# Patient Record
Sex: Male | Born: 1985
Health system: Southern US, Community
[De-identification: ages and names within clinical notes are randomized; demographics above are authoritative.]

## PROBLEM LIST (undated history)

## (undated) DIAGNOSIS — F909 Attention-deficit hyperactivity disorder, unspecified type: Secondary | ICD-10-CM

---
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
## 2006-12-18 HISTORY — PX: KNEE SURGERY: SHX244

## 2013-09-09 ENCOUNTER — Emergency Department (INDEPENDENT_AMBULATORY_CARE_PROVIDER_SITE_OTHER)
Admission: EM | Admit: 2013-09-09 | Discharge: 2013-09-09 | Disposition: A | Discharge status: Home / Self Care | Payer: Self-pay | Source: Home / Self Care

## 2013-09-09 ENCOUNTER — Encounter (HOSPITAL_COMMUNITY): Payer: Self-pay | Admitting: Emergency Medicine

## 2013-09-09 DIAGNOSIS — J410 Simple chronic bronchitis: Secondary | ICD-10-CM

## 2013-09-09 DIAGNOSIS — Z720 Tobacco use: Secondary | ICD-10-CM

## 2013-09-09 DIAGNOSIS — J019 Acute sinusitis, unspecified: Secondary | ICD-10-CM

## 2013-09-09 DIAGNOSIS — J40 Bronchitis, not specified as acute or chronic: Secondary | ICD-10-CM

## 2013-09-09 MED ORDER — FLUTICASONE PROPIONATE 50 MCG/ACT NA SUSP: 2.0000 | Freq: Two times a day (BID) | NASAL | Status: DC

## 2013-09-09 MED ORDER — HYDROCOD POLST-CHLORPHEN POLST 10-8 MG/5ML PO LQCR: 5.0000 mL | Freq: Two times a day (BID) | ORAL | Status: DC | PRN

## 2013-09-09 MED ORDER — ONDANSETRON 4 MG PO TBDP
ORAL_TABLET | ORAL | Status: AC
  Filled 2013-09-09: qty 1

## 2013-09-09 MED ORDER — ONDANSETRON 4 MG PO TBDP: 4.0000 mg | ORAL_TABLET | Freq: Once | ORAL | Status: DC

## 2013-09-09 MED ORDER — LEVOFLOXACIN 500 MG PO TABS: 500.0000 mg | ORAL_TABLET | Freq: Every day | ORAL | Status: DC

## 2013-09-09 NOTE — ED Provider Notes (Addendum)
CSN: [id number]     Arrival date & time 09/09/13  0272 History   None    Chief Complaint  Patient presents with  . URI    onset last friday. fever. cong. cough. nausea. sinus pressure and pain   (Consider location/radiation/quality/duration/timing/severity/associated sxs/prior Treatment) Patient is a 27 y.o. male presenting with URI. The history is provided by the patient.  URI Presenting symptoms: congestion, cough, fever and rhinorrhea   Severity:  Moderate Onset quality:  Gradual Duration:  5 days Progression:  Worsening Chronicity:  New Ineffective treatments:  OTC medications Associated symptoms: sinus pain and wheezing   Risk factors comment:  Smoker   History reviewed. No pertinent past medical history. Past Surgical History  Procedure Laterality Date  . Knee surgery     History reviewed. No pertinent family history. History  Substance Use Topics  . Smoking status: Current Every Day Smoker -- 0.50 packs/day    Types: Cigarettes  . Smokeless tobacco: Not on file  . Alcohol Use: Yes    Review of Systems  Constitutional: Positive for fever.  HENT: Positive for congestion and rhinorrhea.   Respiratory: Positive for cough and wheezing.     Allergies  Review of patient's allergies indicates no known allergies.  Home Medications   Current Outpatient Rx  Name  Route  Sig  Dispense  Refill  . chlorpheniramine-HYDROcodone (TUSSIONEX PENNKINETIC ER) 10-8 MG/5ML LQCR   Oral   Take 5 mLs by mouth every 12 (twelve) hours as needed.   115 mL   1   . fluticasone (FLONASE) 50 MCG/ACT nasal spray   Nasal   Place 2 sprays into the nose 2 (two) times daily.   1 g   2   . levofloxacin (LEVAQUIN) 500 MG tablet   Oral   Take 1 tablet (500 mg total) by mouth daily.   10 tablet   0    BP 119/77  Pulse 64  Temp(Src) 97.5 F (36.4 C) (Oral)  Resp 14  SpO2 95% Physical Exam  Nursing note and vitals reviewed. Constitutional: He is oriented to person, place, and  time. He appears well-developed and well-nourished.  HENT:  Head: Normocephalic.  Right Ear: External ear normal.  Left Ear: External ear normal.  Nose: Mucosal edema and rhinorrhea present.  Mouth/Throat: Oropharynx is clear and moist.  Eyes: Conjunctivae are normal. Pupils are equal, round, and reactive to light.  Neck: Normal range of motion. Neck supple.  Cardiovascular: Normal rate, regular rhythm and normal heart sounds.   Pulmonary/Chest: He has wheezes. He exhibits no tenderness.  Abdominal: Soft. Bowel sounds are normal.  Lymphadenopathy:    He has no cervical adenopathy.  Neurological: He is alert and oriented to person, place, and time.  Skin: Skin is warm and dry.    ED Course  Procedures (including critical care time) Labs Review Labs Reviewed - No data to display Imaging Review No results found.  MDM      Linna Hoff, MD 09/09/13 1031  Linna Hoff, MD 09/09/13 1034

## 2013-09-09 NOTE — ED Notes (Signed)
C/o productive cough with dark colored sputum. Fever. Congestion with wheezing. No hx of asthma. Nausea onset today. Bilateral eye redness and irritation. States eyes are crusted in the a.m Pt has been taking mucinex with no relief and tylenol for fever.  Onset Friday.  Denies vomiting and diarrhea.

## 2013-10-07 ENCOUNTER — Encounter (HOSPITAL_COMMUNITY): Payer: Self-pay | Admitting: Emergency Medicine

## 2013-10-07 ENCOUNTER — Emergency Department (INDEPENDENT_AMBULATORY_CARE_PROVIDER_SITE_OTHER)
Admission: EM | Admit: 2013-10-07 | Discharge: 2013-10-07 | Disposition: A | Discharge status: Home / Self Care | Payer: Self-pay | Source: Home / Self Care

## 2013-10-07 DIAGNOSIS — A0811 Acute gastroenteropathy due to Norwalk agent: Secondary | ICD-10-CM

## 2013-10-07 MED ORDER — ONDANSETRON HCL 4 MG PO TABS: 4.0000 mg | ORAL_TABLET | Freq: Four times a day (QID) | ORAL | Status: DC

## 2013-10-07 MED ORDER — ONDANSETRON 4 MG PO TBDP
ORAL_TABLET | ORAL | Status: AC
  Filled 2013-10-07: qty 2

## 2013-10-07 MED ORDER — ONDANSETRON 4 MG PO TBDP
8.0000 mg | ORAL_TABLET | Freq: Once | ORAL | Status: AC
  Administered 2013-10-07: 8 mg via ORAL

## 2013-10-07 NOTE — ED Provider Notes (Signed)
CSN: [id number]     Arrival date & time 10/07/13  1843 History   None    Chief Complaint  Patient presents with  . Emesis   (Consider location/radiation/quality/duration/timing/severity/associated sxs/prior Treatment) Patient is a 27 y.o. male presenting with vomiting. The history is provided by the patient.  Emesis Severity:  Moderate Duration:  12 hours Timing:  Intermittent Quality:  Stomach contents Progression:  Unchanged Chronicity:  New Worsened by:  Nothing tried Ineffective treatments:  None tried Associated symptoms: diarrhea   Associated symptoms: no abdominal pain, no chills, no cough and no fever   Risk factors: no sick contacts, no suspect food intake and no travel to endemic areas     History reviewed. No pertinent past medical history. Past Surgical History  Procedure Laterality Date  . Knee surgery     History reviewed. No pertinent family history. History  Substance Use Topics  . Smoking status: Current Every Day Smoker -- 0.50 packs/day    Types: Cigarettes  . Smokeless tobacco: Not on file  . Alcohol Use: Yes    Review of Systems  Constitutional: Negative.  Negative for chills.  Gastrointestinal: Positive for nausea, vomiting and diarrhea. Negative for abdominal pain, blood in stool, anal bleeding and rectal pain.    Allergies  Review of patient's allergies indicates no known allergies.  Home Medications   Current Outpatient Rx  Name  Route  Sig  Dispense  Refill  . chlorpheniramine-HYDROcodone (TUSSIONEX PENNKINETIC ER) 10-8 MG/5ML LQCR   Oral   Take 5 mLs by mouth every 12 (twelve) hours as needed.   115 mL   1   . fluticasone (FLONASE) 50 MCG/ACT nasal spray   Nasal   Place 2 sprays into the nose 2 (two) times daily.   1 g   2   . levofloxacin (LEVAQUIN) 500 MG tablet   Oral   Take 1 tablet (500 mg total) by mouth daily.   10 tablet   0   . ondansetron (ZOFRAN) 4 MG tablet   Oral   Take 1 tablet (4 mg total) by mouth every 6  (six) hours.   6 tablet   0    There were no vitals taken for this visit. Physical Exam  Nursing note and vitals reviewed. Constitutional: He is oriented to person, place, and time. He appears well-developed and well-nourished. No distress.  HENT:  Mouth/Throat: Oropharynx is clear and moist.  Neck: Normal range of motion. Neck supple.  Abdominal: Soft. Bowel sounds are normal. He exhibits no distension and no mass. There is no tenderness. There is no rebound and no guarding.  Lymphadenopathy:    He has no cervical adenopathy.  Neurological: He is alert and oriented to person, place, and time.  Skin: Skin is warm and dry.    ED Course  Procedures (including critical care time) Labs Review Labs Reviewed - No data to display Imaging Review No results found.    MDM      Linna Hoff, MD 10/07/13 2022

## 2013-10-07 NOTE — ED Notes (Signed)
C/o vomiting which started this morning.  Patient states he does have diarrhea.  Admits to eating at sub station yesterday.

## 2016-01-25 ENCOUNTER — Encounter: Payer: Self-pay | Admitting: Family Medicine

## 2016-01-25 ENCOUNTER — Ambulatory Visit (INDEPENDENT_AMBULATORY_CARE_PROVIDER_SITE_OTHER): Payer: 59 | Admitting: Family Medicine

## 2016-01-25 VITALS — BP 106/73 | HR 97 | Temp 98.4°F | Wt 210.9 lb

## 2016-01-25 DIAGNOSIS — F1911 Other psychoactive substance abuse, in remission: Secondary | ICD-10-CM | POA: Insufficient documentation

## 2016-01-25 DIAGNOSIS — Z720 Tobacco use: Secondary | ICD-10-CM

## 2016-01-25 DIAGNOSIS — F17200 Nicotine dependence, unspecified, uncomplicated: Secondary | ICD-10-CM | POA: Insufficient documentation

## 2016-01-25 DIAGNOSIS — F909 Attention-deficit hyperactivity disorder, unspecified type: Secondary | ICD-10-CM

## 2016-01-25 DIAGNOSIS — F900 Attention-deficit hyperactivity disorder, predominantly inattentive type: Secondary | ICD-10-CM | POA: Insufficient documentation

## 2016-01-25 DIAGNOSIS — Z87898 Personal history of other specified conditions: Secondary | ICD-10-CM | POA: Diagnosis not present

## 2016-01-25 MED ORDER — AMPHETAMINE-DEXTROAMPHET ER 30 MG PO CP24: 30.0000 mg | ORAL_CAPSULE | Freq: Every day | ORAL | Status: DC

## 2016-01-25 MED ORDER — AMPHETAMINE-DEXTROAMPHETAMINE 30 MG PO TABS: 30.0000 mg | ORAL_TABLET | Freq: Every day | ORAL | Status: DC

## 2016-01-25 MED FILL — AMPHETAMINE SALTS 30 MG TAB: 30 | 30 days supply | Qty: 30 | Fill #0

## 2016-01-25 MED FILL — DEXTROAMP-AMPHET ER 30 MG C: 30 | 30 days supply | Qty: 30 | Fill #0

## 2016-01-25 NOTE — Progress Notes (Signed)
HPI  CC: establish care Patient is here to establish care in our clinic. He states that he recently obtained insurance and is in need of establishing care. He has some previous medical history which is in need of medications and appropriate monitoring. He also states that he is very much interested in "getting healthier". He states that he is currently engaged and would like to develop healthier habits before he begins having children. He endorses a 1 pack a day smoking history over the past 10 years. He states that he is not very physically active but would like to improve this. He also states his fiance is a current smoker and the couple is trying to make these changes at the same time.  ROS: He denies any recent fatigue, dizziness, vision changes, headache, shortness of breath, chest pain, abdominal pain, nausea, vomiting, diarrhea, constipation, weakness, or numbness.   Past medical history and social history reviewed and updated in the EMR as appropriate.  Objective: BP 106/73 mmHg  Pulse 97  Temp(Src) 98.4 F (36.9 C) (Oral)  Wt 210 lb 14.4 oz (95.664 kg) Gen: NAD, alert, cooperative, and pleasant. HEENT: NCAT, EOMI, PERRL CV: RRR, no murmur Resp: CTAB, no wheezes, non-labored Neuro: Alert and oriented, Speech clear, No gross deficits, appears slightly anxious versus hyperactive.  Assessment and plan:  Attention deficit hyperactivity disorder (ADHD) Patient endorses a previous diagnosis with ADHD. He states that in middle school he was diagnosed by a psychiatrist and was able to describe the appropriate workup and tests that were performed. He states that he has not been able to be on this medication for quite some time and it is starting to inhibit some of his abilities at work. He would like to get back on this medication and we are able to discuss appropriate methods to which to do so. - I prescribed patient Adderall XR for daily use as well as Adderall IR to use when  necessary for days requiring longer focus (for "breakthrough"). - Patient is to follow-up in 3 months  H/O: substance abuse Patient states that he is currently being treated with methadone. He is seen at an outside provider for this medication.  Tobacco abuse Patient is currently smoking 1 pack a day and has been for the past 10 years. He is interested in quitting today. We discussed options for this issue. I have asked that he think about the Route which he wants to begin as well as picking a date in the future to act as the quit date.      Meds ordered this encounter  Medications  . DISCONTD: amphetamine-dextroamphetamine (ADDERALL XR) 30 MG 24 hr capsule    Sig: Take 1 capsule (30 mg total) by mouth daily.    Dispense:  31 capsule    Refill:  0  . DISCONTD: amphetamine-dextroamphetamine (ADDERALL) 30 MG tablet    Sig: Take 1 tablet by mouth daily.    Dispense:  31 tablet    Refill:  0  . DISCONTD: amphetamine-dextroamphetamine (ADDERALL XR) 30 MG 24 hr capsule    Sig: Take 1 capsule (30 mg total) by mouth daily.    Dispense:  31 capsule    Refill:  0    Do Not Fill Until 02/22/2016  . amphetamine-dextroamphetamine (ADDERALL XR) 30 MG 24 hr capsule    Sig: Take 1 capsule (30 mg total) by mouth daily.    Dispense:  31 capsule    Refill:  0    Do Not Fill  Until 03/24/2016  . DISCONTD: amphetamine-dextroamphetamine (ADDERALL) 30 MG tablet    Sig: Take 1 tablet by mouth daily.    Dispense:  31 tablet    Refill:  0    Do Not Fill Until 02/22/2016  . amphetamine-dextroamphetamine (ADDERALL) 30 MG tablet    Sig: Take 1 tablet by mouth daily.    Dispense:  31 tablet    Refill:  0    Do Not Fill Until 03/24/2016     Kathee Delton, MD,MS,  PGY2 01/25/2016 9:13 PM

## 2016-01-25 NOTE — Assessment & Plan Note (Signed)
Patient is currently smoking 1 pack a day and has been for the past 10 years. He is interested in quitting today. We discussed options for this issue. I have asked that he think about the Route which he wants to begin as well as picking a date in the future to act as the quit date.

## 2016-01-25 NOTE — Assessment & Plan Note (Signed)
Patient states that he is currently being treated with methadone. He is seen at an outside provider for this medication.

## 2016-01-25 NOTE — Assessment & Plan Note (Signed)
Patient endorses a previous diagnosis with ADHD. He states that in middle school he was diagnosed by a psychiatrist and was able to describe the appropriate workup and tests that were performed. He states that he has not been able to be on this medication for quite some time and it is starting to inhibit some of his abilities at work. He would like to get back on this medication and we are able to discuss appropriate methods to which to do so. - I prescribed patient Adderall XR for daily use as well as Adderall IR to use when necessary for days requiring longer focus (for "breakthrough"). - Patient is to follow-up in 3 months

## 2016-01-25 NOTE — Patient Instructions (Signed)
It was a pleasure seeing you today in our clinic. Today we established you in our clinic. Here is the treatment plan we have discussed and agreed upon together:   - I provided you orders for medications you've been on. - I would like to have you follow-up with me in 3 months for medication refills as well as a physical exam. - If you have any questions or issues to discuss do not hesitate to contact our office and have our staff leave me a message.

## 2016-02-22 MED FILL — DEXTROAMP-AMP 30 MG TABLET: 30 | 30 days supply | Qty: 30 | Fill #0

## 2016-02-22 MED FILL — DEXTROAMP-AMPHET ER 30 MG C: 30 | 30 days supply | Qty: 30 | Fill #0

## 2016-03-24 MED FILL — DEXTROAMP-AMPHET ER 30 MG C: 30 | 30 days supply | Qty: 30 | Fill #0

## 2016-03-24 MED FILL — DEXTROAMP-AMP 30 MG TABLET: 30 | 30 days supply | Qty: 30 | Fill #0

## 2016-04-21 ENCOUNTER — Ambulatory Visit (INDEPENDENT_AMBULATORY_CARE_PROVIDER_SITE_OTHER): Payer: 59 | Admitting: Family Medicine

## 2016-04-21 ENCOUNTER — Encounter: Payer: Self-pay | Admitting: Family Medicine

## 2016-04-21 VITALS — BP 115/71 | HR 77 | Temp 98.2°F | Ht 69.0 in | Wt 200.0 lb

## 2016-04-21 DIAGNOSIS — F419 Anxiety disorder, unspecified: Secondary | ICD-10-CM | POA: Diagnosis not present

## 2016-04-21 DIAGNOSIS — F909 Attention-deficit hyperactivity disorder, unspecified type: Secondary | ICD-10-CM | POA: Diagnosis not present

## 2016-04-21 DIAGNOSIS — F411 Generalized anxiety disorder: Secondary | ICD-10-CM | POA: Insufficient documentation

## 2016-04-21 MED ORDER — AMPHETAMINE-DEXTROAMPHET ER 30 MG PO CP24: 30.0000 mg | ORAL_CAPSULE | Freq: Every day | ORAL | Status: DC

## 2016-04-21 MED ORDER — AMPHETAMINE-DEXTROAMPHETAMINE 30 MG PO TABS: 30.0000 mg | ORAL_TABLET | Freq: Every day | ORAL | Status: DC

## 2016-04-21 MED FILL — DEXTROAMP-AMPHET ER 30 MG C: 30 | 30 days supply | Qty: 30 | Fill #0

## 2016-04-21 MED FILL — DEXTROAMP-AMP 30 MG TABLET: 30 | 30 days supply | Qty: 30 | Fill #0

## 2016-04-21 NOTE — Assessment & Plan Note (Signed)
Patient had one episode of an "anxiety attack". We discussed this in detail during the visit and I expressed that his current Adderall prescription could make this worse. I informed him that I would recommend that he reduce the use of the instant release Adderall prescribed for his breakthrough symptoms, as this may cause some of his anxiety. - Because this is the first occurrence and he has not had any subsequent episodes I will not be prescribing any medications at this time. - If patient does begin to exhibit more frequent symptoms I will most certainly reduce the dosing of his Adderall. I would also plan on providing a short prescription for Atarax, however none of these actions seem necessary at this time.

## 2016-04-21 NOTE — Progress Notes (Signed)
HPI  CC: Medication refill and anxiety. Patient is here for medication refill. He states that he has been doing significantly better since the initiation of his recent medication. He feels as though his thoughts are much more concise and he does not have any issues with focus. He is very pleased with the current dosing of the medication regimen at this time and does not desire to increase or decrease at this time. He denies any issues with palpitations shortness of breath dry mouth headache nausea vomiting or diarrhea.  Patient does endorse a recent "anxiety attack". He states that while he was on car in a baseball game he became very anxious, and flustered. He states that he "knew it was coming". He endorses periodic shortness of breath and diaphoresis. Denies any chest pain. He states that he had to call a timeout during the game in order for him to calm down. This lasted for about 15-20 minutes. He has not had experiences like this since.  He was recently brought to my attention the patient is currently prescribed Suboxone. This makes me significantly uncomfortable prescribing Adderall. He states that he is seen on a monthly basis by Dr. Wynonia Lawman.   Review of Systems   See HPI for ROS. All other systems reviewed and are negative.  CC, SH/smoking status, and VS noted  Objective: BP 115/71 mmHg  Pulse 77  Temp(Src) 98.2 F (36.8 C) (Oral)  Ht  (1.753 m)  Wt 200 lb (90.719 kg)  BMI 29.52 kg/m2 Gen: NAD, alert, cooperative, and pleasant. CV: RRR, no murmur Resp: CTAB, no wheezes, non-labored Abd: SNTND, BS present, no guarding or organomegaly Ext: No edema, warm Neuro: Alert and oriented, Speech clear, No gross deficits  Assessment and plan:  Attention deficit hyperactivity disorder (ADHD) Improved: Patient states that his lack of focus and inattention has improved since initiation of his Adderall medications. His recently brought to my attention that patient is on Suboxone for  his history of opiate addiction. Because of this I am very uncomfortable with him being on Adderall without discussing with the provider prescribing his Suboxone. I have attempted to contact Dr. Sharyon Medicus. He is currently out of the office but I left a message to have him contact our office with an ideal time to discuss this patient. - Patient's Adderall prescriptions were (reluctantly) refilled. - I will look further and this issue as I am currently undecided on whether or not I will continue prescribing Adderall for this patient.  Anxiety Patient had one episode of an "anxiety attack". We discussed this in detail during the visit and I expressed that his current Adderall prescription could make this worse. I informed him that I would recommend that he reduce the use of the instant release Adderall prescribed for his breakthrough symptoms, as this may cause some of his anxiety. - Because this is the first occurrence and he has not had any subsequent episodes I will not be prescribing any medications at this time. - If patient does begin to exhibit more frequent symptoms I will most certainly reduce the dosing of his Adderall. I would also plan on providing a short prescription for Atarax, however none of these actions seem necessary at this time.    Meds ordered this encounter  Medications  . DISCONTD: amphetamine-dextroamphetamine (ADDERALL XR) 30 MG 24 hr capsule    Sig: Take 1 capsule (30 mg total) by mouth daily.    Dispense:  31 capsule    Refill:  0  Do Not Fill Until 04/23/2016  . DISCONTD: amphetamine-dextroamphetamine (ADDERALL) 30 MG tablet    Sig: Take 1 tablet by mouth daily.    Dispense:  31 tablet    Refill:  0    Do Not Fill Until 04/23/2016  . DISCONTD: amphetamine-dextroamphetamine (ADDERALL XR) 30 MG 24 hr capsule    Sig: Take 1 capsule (30 mg total) by mouth daily.    Dispense:  31 capsule    Refill:  0    Do Not Fill Until 05/24/2016  . DISCONTD:  amphetamine-dextroamphetamine (ADDERALL) 30 MG tablet    Sig: Take 1 tablet by mouth daily.    Dispense:  31 tablet    Refill:  0    Do Not Fill Until 05/24/2016  . amphetamine-dextroamphetamine (ADDERALL XR) 30 MG 24 hr capsule    Sig: Take 1 capsule (30 mg total) by mouth daily.    Dispense:  31 capsule    Refill:  0    Do Not Fill Until 06/23/2016  . amphetamine-dextroamphetamine (ADDERALL) 30 MG tablet    Sig: Take 1 tablet by mouth daily.    Dispense:  31 tablet    Refill:  0    Do Not Fill Until 06/23/2016     Kathee DeltonIan D Niquita Digioia, MD,MS,  PGY2 04/21/2016 5:22 PM

## 2016-04-21 NOTE — Patient Instructions (Signed)
It was a pleasure seeing you today in our clinic. Today we discussed your medications. Here is the treatment plan we have discussed and agreed upon together:   - Continue taking the medications as prescribed. He does not always have to use the instant release Adderall, as he should only use this for breakthrough symptoms. My hope is that you will require less refills for this medication than with the extended release. - I would like to see you sooner if she continued to have any issues with anxiety. - I will be contacting your other provider to discuss his feelings on continued treatment for ADHD with your history of substance abuse, as I feel he likely has a better grasp on your past medical history.

## 2016-04-21 NOTE — Assessment & Plan Note (Signed)
Improved: Patient states that his lack of focus and inattention has improved since initiation of his Adderall medications. His recently brought to my attention that patient is on Suboxone for his history of opiate addiction. Because of this I am very uncomfortable with him being on Adderall without discussing with the provider prescribing his Suboxone. I have attempted to contact Dr. Sharyon MedicusHijazi. He is currently out of the office but I left a message to have him contact our office with an ideal time to discuss this patient. - Patient's Adderall prescriptions were (reluctantly) refilled. - I will look further and this issue as I am currently undecided on whether or not I will continue prescribing Adderall for this patient.

## 2016-05-24 MED FILL — DEXTROAMP-AMPHET ER 30 MG C: 30 | 30 days supply | Qty: 30 | Fill #0

## 2016-05-24 MED FILL — AMPHETAMINE SALTS 30 MG TAB: 30 | 30 days supply | Qty: 30 | Fill #0

## 2016-06-26 MED FILL — DEXTROAMP-AMP 30 MG TABLET: 30 | 30 days supply | Qty: 30 | Fill #0

## 2016-06-26 MED FILL — DEXTROAMP-AMPHET ER 30 MG C: 30 | 30 days supply | Qty: 30 | Fill #0

## 2016-07-17 ENCOUNTER — Ambulatory Visit (INDEPENDENT_AMBULATORY_CARE_PROVIDER_SITE_OTHER): Payer: 59 | Admitting: Family Medicine

## 2016-07-17 ENCOUNTER — Encounter: Payer: Self-pay | Admitting: Family Medicine

## 2016-07-17 DIAGNOSIS — F900 Attention-deficit hyperactivity disorder, predominantly inattentive type: Secondary | ICD-10-CM | POA: Diagnosis not present

## 2016-07-17 MED ORDER — AMPHETAMINE-DEXTROAMPHET ER 30 MG PO CP24: 30.0000 mg | ORAL_CAPSULE | Freq: Every day | ORAL | 0 refills | Status: DC

## 2016-07-17 MED ORDER — AMPHETAMINE-DEXTROAMPHETAMINE 30 MG PO TABS: 30.0000 mg | ORAL_TABLET | Freq: Every day | ORAL | 0 refills | Status: DC

## 2016-07-17 NOTE — Progress Notes (Signed)
   HPI  CC: Medication refill Patient is here for medication refill. He states he is doing very well on the current medication regimen. He states that he has no interest in changing the current regimen. He denies any symptoms or side effects at this time. He states that work is going well and he has had no issues with dehydration at this time.  ROS: Denies fever, chills, shortness breath, palpitations, chest pain, weight loss, dizziness, lightheadedness, syncope, or anorexia.  CC, SH/smoking status, and VS noted  Objective: BP 117/82   Pulse 81   Temp 98.5 F (36.9 C) (Oral)   Ht 5\' 9"  (1.753 m)   Wt 197 lb 3.2 oz (89.4 kg)   BMI 29.12 kg/m  Gen: NAD, alert, cooperative, and pleasant. CV: RRR, no murmur Resp: CTAB, no wheezes, non-labored Neuro: Alert, Speech clear, No gross deficits  Assessment and plan:  Attention deficit hyperactivity disorder (ADHD) Patient is here for medication refill. He states he is doing well and has no interest in changing medication regimen at this time. - Refills provided   Meds ordered this encounter  Medications  . DISCONTD: amphetamine-dextroamphetamine (ADDERALL XR) 30 MG 24 hr capsule    Sig: Take 1 capsule (30 mg total) by mouth daily.    Dispense:  31 capsule    Refill:  0    Do Not Fill Until 07/17/2016  . DISCONTD: amphetamine-dextroamphetamine (ADDERALL XR) 30 MG 24 hr capsule    Sig: Take 1 capsule (30 mg total) by mouth daily.    Dispense:  31 capsule    Refill:  0    Do Not Fill Until 08/16/2016  . amphetamine-dextroamphetamine (ADDERALL XR) 30 MG 24 hr capsule    Sig: Take 1 capsule (30 mg total) by mouth daily.    Dispense:  31 capsule    Refill:  0    Do Not Fill Until 09/16/2016  . DISCONTD: amphetamine-dextroamphetamine (ADDERALL) 30 MG tablet    Sig: Take 1 tablet by mouth daily.    Dispense:  31 tablet    Refill:  0    Do Not Fill Until 07/16/2016  . DISCONTD: amphetamine-dextroamphetamine (ADDERALL) 30 MG tablet   Sig: Take 1 tablet by mouth daily.    Dispense:  31 tablet    Refill:  0    Do Not Fill Until 08/16/2016  . amphetamine-dextroamphetamine (ADDERALL) 30 MG tablet    Sig: Take 1 tablet by mouth daily.    Dispense:  31 tablet    Refill:  0    Do Not Fill Until 09/16/2016     Kathee Delton, MD,MS,  PGY3 07/17/2016 6:35 PM

## 2016-07-17 NOTE — Assessment & Plan Note (Signed)
Patient is here for medication refill. He states he is doing well and has no interest in changing medication regimen at this time. - Refills provided

## 2016-07-17 NOTE — Patient Instructions (Signed)
Attention Deficit Hyperactivity Disorder  Attention deficit hyperactivity disorder (ADHD) is a problem with behavior issues based on the way the brain functions (neurobehavioral disorder). It is a common reason for behavior and academic problems in school.  SYMPTOMS   There are 3 types of ADHD. The 3 types and some of the symptoms include:  · Inattentive.    Gets bored or distracted easily.    Loses or forgets things. Forgets to hand in homework.    Has trouble organizing or completing tasks.    Difficulty staying on task.    An inability to organize daily tasks and school work.    Leaving projects, chores, or homework unfinished.    Trouble paying attention or responding to details. Careless mistakes.    Difficulty following directions. Often seems like is not listening.    Dislikes activities that require sustained attention (like chores or homework).  · Hyperactive-impulsive.    Feels like it is impossible to sit still or stay in a seat. Fidgeting with hands and feet.    Trouble waiting turn.    Talking too much or out of turn. Interruptive.    Speaks or acts impulsively.    Aggressive, disruptive behavior.    Constantly busy or on the go; noisy.    Often leaves seat when they are expected to remain seated.    Often runs or climbs where it is not appropriate, or feels very restless.  · Combined.    Has symptoms of both of the above.  Often children with ADHD feel discouraged about themselves and with school. They often perform well below their abilities in school.  As children get older, the excess motor activities can calm down, but the problems with paying attention and staying organized persist. Most children do not outgrow ADHD but with good treatment can learn to cope with the symptoms.  DIAGNOSIS   When ADHD is suspected, the diagnosis should be made by professionals trained in ADHD. This professional will collect information about the individual suspected of having ADHD. Information must be collected from  various settings where the person lives, works, or attends school.    Diagnosis will include:  · Confirming symptoms began in childhood.  · Ruling out other reasons for the child's behavior.  · The health care providers will check with the child's school and check their medical records.  · They will talk to teachers and parents.  · Behavior rating scales for the child will be filled out by those dealing with the child on a daily basis.  A diagnosis is made only after all information has been considered.  TREATMENT   Treatment usually includes behavioral treatment, tutoring or extra support in school, and stimulant medicines. Because of the way a person's brain works with ADHD, these medicines decrease impulsivity and hyperactivity and increase attention. This is different than how they would work in a person who does not have ADHD. Other medicines used include antidepressants and certain blood pressure medicines.  Most experts agree that treatment for ADHD should address all aspects of the person's functioning. Along with medicines, treatment should include structured classroom management at school. Parents should reward good behavior, provide constant discipline, and set limits. Tutoring should be available for the child as needed.  ADHD is a lifelong condition. If untreated, the disorder can have long-term serious effects into adolescence and adulthood.  HOME CARE INSTRUCTIONS   · Often with ADHD there is a lot of frustration among family members dealing with the condition. Blame   and anger are also feelings that are common. In many cases, because the problem affects the family as a whole, the entire family may need help. A therapist can help the family find better ways to handle the disruptive behaviors of the person with ADHD and promote change. If the person with ADHD is young, most of the therapist's work is with the parents. Parents will learn techniques for coping with and improving their child's behavior.  Sometimes only the child with the ADHD needs counseling. Your health care providers can help you make these decisions.  · Children with ADHD may need help learning how to organize. Some helpful tips include:  ¨ Keep routines the same every day from wake-up time to bedtime. Schedule all activities, including homework and playtime. Keep the schedule in a place where the person with ADHD will often see it. Mark schedule changes as far in advance as possible.  ¨ Schedule outdoor and indoor recreation.  ¨ Have a place for everything and keep everything in its place. This includes clothing, backpacks, and school supplies.  ¨ Encourage writing down assignments and bringing home needed books. Work with your child's teachers for assistance in organizing school work.  · Offer your child a well-balanced diet. Breakfast that includes a balance of whole grains, protein, and fruits or vegetables is especially important for school performance. Children should avoid drinks with caffeine including:  ¨ Soft drinks.  ¨ Coffee.  ¨ Tea.  ¨ However, some older children (adolescents) may find these drinks helpful in improving their attention. Because it can also be common for adolescents with ADHD to become addicted to caffeine, talk with your health care provider about what is a safe amount of caffeine intake for your child.  · Children with ADHD need consistent rules that they can understand and follow. If rules are followed, give small rewards. Children with ADHD often receive, and expect, criticism. Look for good behavior and praise it. Set realistic goals. Give clear instructions. Look for activities that can foster success and self-esteem. Make time for pleasant activities with your child. Give lots of affection.  · Parents are their children's greatest advocates. Learn as much as possible about ADHD. This helps you become a stronger and better advocate for your child. It also helps you educate your child's teachers and instructors  if they feel inadequate in these areas. Parent support groups are often helpful. A national group with local chapters is called Children and Adults with Attention Deficit Hyperactivity Disorder (CHADD).  SEEK MEDICAL CARE IF:  · Your child has repeated muscle twitches, cough, or speech outbursts.  · Your child has sleep problems.  · Your child has a marked loss of appetite.  · Your child develops depression.  · Your child has new or worsening behavioral problems.  · Your child develops dizziness.  · Your child has a racing heart.  · Your child has stomach pains.  · Your child develops headaches.  SEEK IMMEDIATE MEDICAL CARE IF:  · Your child has been diagnosed with depression or anxiety and the symptoms seem to be getting worse.  · Your child has been depressed and suddenly appears to have increased energy or motivation.  · You are worried that your child is having a bad reaction to a medication he or she is taking for ADHD.     This information is not intended to replace advice given to you by your health care provider. Make sure you discuss any questions you have with your   health care provider.     Document Released: 11/24/2002 Document Revised: 12/09/2013 Document Reviewed: 08/11/2013  Elsevier Interactive Patient Education ©2016 [employer].

## 2016-08-02 MED FILL — AMPHETAMINE SALTS 30 MG TAB: 30 | 30 days supply | Qty: 30 | Fill #0

## 2016-08-02 MED FILL — DEXTROAMP-AMPHET ER 30 MG C: 30 | 30 days supply | Qty: 30 | Fill #0

## 2016-08-31 MED FILL — AMPHETAMINE SALTS 30 MG TAB: 30 | 30 days supply | Qty: 30 | Fill #0

## 2016-08-31 MED FILL — DEXTROAMP-AMPHET ER 30 MG C: 30 | 30 days supply | Qty: 30 | Fill #0

## 2016-10-02 MED FILL — DEXTROAMP-AMPHET ER 30 MG C: 30 | 30 days supply | Qty: 30 | Fill #0

## 2016-10-02 MED FILL — AMPHETAMINE SALTS 30 MG TAB: 30 | 30 days supply | Qty: 30 | Fill #0

## 2016-11-01 ENCOUNTER — Other Ambulatory Visit: Payer: Self-pay | Admitting: Family Medicine

## 2016-11-01 NOTE — Telephone Encounter (Signed)
Pt appointment was canceled due to PCP being out of the office, no open am appointments tomorrow. Pt would like to know if he could get a refill on adderall , just one month's worth until he can take more time off work for an appointment. Please advise. Thanks! ep

## 2016-11-02 ENCOUNTER — Ambulatory Visit: Payer: 59 | Admitting: Family Medicine

## 2016-11-02 MED ORDER — AMPHETAMINE-DEXTROAMPHET ER 30 MG PO CP24: 30.0000 mg | ORAL_CAPSULE | Freq: Every day | ORAL | 0 refills | Status: DC

## 2016-11-02 NOTE — Telephone Encounter (Signed)
One month refill printed for patient and left at front desk. Patient notified that he will need to be seen in office before any additional refills will be sent in.   Danny AbernethyAbigail J Lillymae Duet, MD, MPH PGY-2 [facility] Pager [phone]

## 2016-11-02 NOTE — Telephone Encounter (Signed)
Left message on voicemail that rx was ready to be picked up and to schedule an appointment for further refills.

## 2016-11-14 ENCOUNTER — Other Ambulatory Visit: Payer: Self-pay | Admitting: Family Medicine

## 2016-11-14 MED ORDER — AMPHETAMINE-DEXTROAMPHETAMINE 30 MG PO TABS: 30.0000 mg | ORAL_TABLET | Freq: Every day | ORAL | 0 refills | Status: DC

## 2016-11-14 NOTE — Telephone Encounter (Signed)
LMOVM for pt to call us back. Kendle Turbin, CMA  

## 2016-11-14 NOTE — Telephone Encounter (Signed)
Needs refill on adderall.  He got the extended release but he also takes the regular one.  [facility]

## 2016-11-14 NOTE — Telephone Encounter (Signed)
Refill left up front.  Patient should be reminded that all refills require an office visit.

## 2016-11-15 MED FILL — AMPHETAMINE SALTS 30 MG TAB: 30 | 30 days supply | Qty: 30 | Fill #0

## 2016-11-15 MED FILL — DEXTROAMP-AMPHET ER 30 MG C: 30 | 30 days supply | Qty: 30 | Fill #0

## 2016-12-22 ENCOUNTER — Ambulatory Visit (INDEPENDENT_AMBULATORY_CARE_PROVIDER_SITE_OTHER): Payer: 59 | Admitting: Family Medicine

## 2016-12-22 VITALS — BP 118/70 | HR 88 | Ht 69.0 in | Wt 197.0 lb

## 2016-12-22 DIAGNOSIS — F900 Attention-deficit hyperactivity disorder, predominantly inattentive type: Secondary | ICD-10-CM | POA: Diagnosis not present

## 2016-12-22 DIAGNOSIS — Z87898 Personal history of other specified conditions: Secondary | ICD-10-CM

## 2016-12-22 DIAGNOSIS — F1911 Other psychoactive substance abuse, in remission: Secondary | ICD-10-CM

## 2016-12-22 MED ORDER — AMPHETAMINE-DEXTROAMPHETAMINE 30 MG PO TABS: 30.0000 mg | ORAL_TABLET | Freq: Every day | ORAL | 0 refills | Status: DC

## 2016-12-22 MED ORDER — AMPHETAMINE-DEXTROAMPHET ER 30 MG PO CP24: 30.0000 mg | ORAL_CAPSULE | Freq: Every day | ORAL | 0 refills | Status: DC

## 2016-12-22 MED FILL — AMPHETAMINE SALTS 30 MG TAB: 30 | 30 days supply | Qty: 30 | Fill #0

## 2016-12-22 MED FILL — ADDERALL XR 30 MG CAP SA: 30 | 30 days supply | Qty: 30 | Fill #0

## 2016-12-22 NOTE — Patient Instructions (Signed)
See you in 3 months.

## 2016-12-22 NOTE — Assessment & Plan Note (Signed)
Stable: Patient feels as though he is well controlled on his current medication regimen. No medication changes deemed necessary at this time. Patient's weight was noted has he has had no significant weight loss or gain since last visit. - Continue current regimen.

## 2016-12-22 NOTE — Progress Notes (Signed)
   HPI  CC: Medication refill Patient is here for medication refill for his ADHD. He states that the current dose of Adderall XL are with additional Adderall IR has been a "perfect" dosing for him. He states that he has not had any side effects at this point and that he continues to feel more focused and maintain concentration better while on this medication. He denies any chest pain, shortness of breath, diaphoresis, headache, vertigo vision, nausea, vomiting, diarrhea, or fever.  Review of Systems    See HPI for ROS. All other systems reviewed and are negative.  CC, SH/smoking status, and VS noted  Objective: BP 118/70   Pulse 88   Ht 5\' 9"  (1.753 m)   Wt 197 lb (89.4 kg)   BMI 29.09 kg/m  Gen: NAD, alert, cooperative, and pleasant. CV: RRR, no murmur Resp: CTAB, no wheezes, non-labored Neuro: Alert, Speech clear, No gross deficits  Assessment and plan:  Attention deficit hyperactivity disorder (ADHD) Stable: Patient feels as though he is well controlled on his current medication regimen. No medication changes deemed necessary at this time. Patient's weight was noted has he has had no significant weight loss or gain since last visit. - Continue current regimen.   Meds ordered this encounter  Medications  . DISCONTD: amphetamine-dextroamphetamine (ADDERALL XR) 30 MG 24 hr capsule    Sig: Take 1 capsule (30 mg total) by mouth daily.    Dispense:  31 capsule    Refill:  0  . DISCONTD: amphetamine-dextroamphetamine (ADDERALL) 30 MG tablet    Sig: Take 1 tablet by mouth daily.    Dispense:  30 tablet    Refill:  0    Do Not Fill Until 12/22/2016  . DISCONTD: amphetamine-dextroamphetamine (ADDERALL XR) 30 MG 24 hr capsule    Sig: Take 1 capsule (30 mg total) by mouth daily.    Dispense:  31 capsule    Refill:  0    Do not fill until 01/22/17  . DISCONTD: amphetamine-dextroamphetamine (ADDERALL) 30 MG tablet    Sig: Take 1 tablet by mouth daily.    Dispense:  30 tablet   Refill:  0    Do Not Fill Until 01/22/2017  . amphetamine-dextroamphetamine (ADDERALL XR) 30 MG 24 hr capsule    Sig: Take 1 capsule (30 mg total) by mouth daily.    Dispense:  31 capsule    Refill:  0    Do not fill until 02/19/17  . amphetamine-dextroamphetamine (ADDERALL) 30 MG tablet    Sig: Take 1 tablet by mouth daily.    Dispense:  30 tablet    Refill:  0    Do Not Fill Until 02/19/2017     Kathee DeltonIan D Kensington Duerst, MD,MS,  PGY3 12/22/2016 5:57 PM

## 2017-01-22 MED FILL — DEXTROAMP-AMP 30 MG TABLET: 30 | 30 days supply | Qty: 30 | Fill #0

## 2017-01-22 MED FILL — ADDERALL XR 30 MG CAP SA: 30 | 30 days supply | Qty: 30 | Fill #0

## 2017-02-23 MED FILL — ADDERALL XR 30 MG CAP SA: 30 | 30 days supply | Qty: 30 | Fill #0

## 2017-02-23 MED FILL — DEXTROAMP-AMP 30 MG TABLET: 30 | 30 days supply | Qty: 30 | Fill #0

## 2017-04-02 ENCOUNTER — Telehealth: Payer: Self-pay | Admitting: Family Medicine

## 2017-04-02 NOTE — Telephone Encounter (Signed)
No answer, LMOVM. Called to confirm appointment for 04/03/17. Patient was advised to arrive early for check-in and to bring any current medications. °

## 2017-04-03 ENCOUNTER — Ambulatory Visit (INDEPENDENT_AMBULATORY_CARE_PROVIDER_SITE_OTHER): Payer: 59 | Admitting: Family Medicine

## 2017-04-03 ENCOUNTER — Other Ambulatory Visit: Payer: Self-pay | Admitting: Family Medicine

## 2017-04-03 ENCOUNTER — Telehealth: Payer: Self-pay | Admitting: Family Medicine

## 2017-04-03 VITALS — BP 112/68 | HR 83 | Temp 97.8°F | Ht 69.0 in | Wt 206.8 lb

## 2017-04-03 DIAGNOSIS — F900 Attention-deficit hyperactivity disorder, predominantly inattentive type: Secondary | ICD-10-CM | POA: Diagnosis not present

## 2017-04-03 DIAGNOSIS — R635 Abnormal weight gain: Secondary | ICD-10-CM

## 2017-04-03 DIAGNOSIS — Z79899 Other long term (current) drug therapy: Secondary | ICD-10-CM | POA: Diagnosis not present

## 2017-04-03 DIAGNOSIS — Z20828 Contact with and (suspected) exposure to other viral communicable diseases: Secondary | ICD-10-CM

## 2017-04-03 MED ORDER — AMPHETAMINE-DEXTROAMPHET ER 15 MG PO CP24: 30.0000 mg | ORAL_CAPSULE | Freq: Every day | ORAL | 0 refills | Status: DC

## 2017-04-03 MED ORDER — AMPHETAMINE-DEXTROAMPHET ER 30 MG PO CP24: 30.0000 mg | ORAL_CAPSULE | Freq: Every day | ORAL | 0 refills | Status: DC

## 2017-04-03 MED ORDER — AMPHETAMINE-DEXTROAMPHETAMINE 30 MG PO TABS: 30.0000 mg | ORAL_TABLET | Freq: Every day | ORAL | 0 refills | Status: DC

## 2017-04-03 MED FILL — AMPHETAMINE SALTS 30 MG TAB: 30 | 30 days supply | Qty: 30 | Fill #0

## 2017-04-03 NOTE — Assessment & Plan Note (Signed)
Stable/controlled: Patient endorses good compliance with his medications at this time. He has no reports of setbacks and feels as though he has controlled his symptoms on the current dosings. No desire to change dosages at this time. - Medications refill 3 months. - Obtain labs as we had not had any labs drawn on him.

## 2017-04-03 NOTE — Progress Notes (Signed)
   HPI  CC: ADHD Patient is here for follow-up on his ADHD. He states that he feels well and he has had no setbacks since our last visit. He states that he feels his ADHD is under good control and he has no desire to increase or decrease his medication dosings at this time.  Endorses some weight gain recently. 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  ROS: Denies headache, vision changes, lightheadedness, aggressive behavior, fever, chills, nausea, vomiting, diarrhea, numbness, weakness, or paresthesias.  CC, SH/smoking status, and VS noted  Objective: BP 112/68   Pulse 83   Temp 97.8 F (36.6 C) (Oral)   Ht  (1.753 m)   Wt 206 lb 12.8 oz (93.8 kg)   SpO2 97%   BMI 30.54 kg/m  Gen: NAD, alert, cooperative, and pleasant. HEENT: NCAT, EOMI, PERRL CV: RRR, no murmur Resp: CTAB, no wheezes, non-labored Ext: No edema, warm Neuro: Alert and oriented, Speech clear, No gross deficits   Assessment and plan:  Attention deficit hyperactivity disorder (ADHD) Stable/controlled: Patient endorses good compliance with his medications at this time. He has no reports of setbacks and feels as though he has controlled his symptoms on the current dosings. No desire to change dosages at this time. - Medications refill 3 months. - Obtain labs as we had not had any labs drawn on him.   Orders Placed This Encounter  Procedures  . Basic Metabolic Panel  . CBC  . TSH  . HIV antibody    Meds ordered this encounter  Medications  . DISCONTD: amphetamine-dextroamphetamine (ADDERALL XR) 15 MG 24 hr capsule    Sig: Take 2 capsules by mouth daily.    Dispense:  60 capsule    Refill:  0  . DISCONTD: amphetamine-dextroamphetamine (ADDERALL) 30 MG tablet    Sig: Take 1 tablet by mouth daily.    Dispense:  30 tablet    Refill:  0    Do Not Fill Until  04/03/2017  . DISCONTD: amphetamine-dextroamphetamine (ADDERALL XR) 15 MG 24 hr capsule    Sig: Take 2 capsules by mouth daily.    Dispense:  60 capsule    Refill:  0  . DISCONTD: amphetamine-dextroamphetamine (ADDERALL) 30 MG tablet    Sig: Take 1 tablet by mouth daily.    Dispense:  30 tablet    Refill:  0    Do Not Fill Until 05/03/2017  . amphetamine-dextroamphetamine (ADDERALL XR) 15 MG 24 hr capsule    Sig: Take 2 capsules by mouth daily.    Dispense:  60 capsule    Refill:  0  . amphetamine-dextroamphetamine (ADDERALL) 30 MG tablet    Sig: Take 1 tablet by mouth daily.    Dispense:  30 tablet    Refill:  0    Do Not Fill Until 06/03/2017     Kathee Delton, MD,MS,  PGY3 04/03/2017 12:51 PM

## 2017-04-03 NOTE — Patient Instructions (Signed)
It was a pleasure seeing you today in our clinic. Today we discussed your ADHD. Here is the treatment plan we have discussed and agreed upon together:   - Take the prescribed Adderall as prescribed. - I will mail you the results of your blood work today. - Come back for reevaluation in 3 months.

## 2017-04-03 NOTE — Telephone Encounter (Signed)
Patient was provided with correct prescriptions and incorrect ones were shred per Dr. Wende Mott.

## 2017-04-03 NOTE — Telephone Encounter (Signed)
pt was seen earlier today and given his prescriptions for Adderall. The pharmacy said that they are written wrong or the insurance wont take it how its written. He is bringing them now and would like the doctor to correct them so he can pick up his medication. jw

## 2017-04-04 LAB — CBC
HEMOGLOBIN: 14 g/dL (ref 13.0–17.7)
Hematocrit: 41.3 % (ref 37.5–51.0)
MCH: 30 pg (ref 26.6–33.0)
MCHC: 33.9 g/dL (ref 31.5–35.7)
MCV: 88 fL (ref 79–97)
PLATELETS: 258 10*3/uL (ref 150–379)
RBC: 4.67 x10E6/uL (ref 4.14–5.80)
RDW: 13 % (ref 12.3–15.4)
WBC: 6.3 10*3/uL (ref 3.4–10.8)

## 2017-04-04 LAB — BASIC METABOLIC PANEL
BUN/Creatinine Ratio: 13 (ref 9–20)
BUN: 15 mg/dL (ref 6–20)
CALCIUM: 9.4 mg/dL (ref 8.7–10.2)
CO2: 24 mmol/L (ref 18–29)
CREATININE: 1.12 mg/dL (ref 0.76–1.27)
Chloride: 101 mmol/L (ref 96–106)
GFR calc Af Amer: 102 mL/min/{1.73_m2} (ref >59)
GFR, EST NON AFRICAN AMERICAN: 88 mL/min/{1.73_m2} (ref >59)
GLUCOSE: 109 mg/dL — AB (ref 65–99)
POTASSIUM: 4.4 mmol/L (ref 3.5–5.2)
SODIUM: 139 mmol/L (ref 134–144)

## 2017-04-04 LAB — HIV ANTIBODY (ROUTINE TESTING W REFLEX): HIV Screen 4th Generation wRfx: NONREACTIVE

## 2017-04-04 LAB — TSH: TSH: 1.11 u[IU]/mL (ref 0.450–4.500)

## 2017-04-04 MED FILL — ADDERALL XR 30 MG CAP SA: 30 | 30 days supply | Qty: 30 | Fill #0

## 2017-04-09 ENCOUNTER — Encounter: Payer: Self-pay | Admitting: Family Medicine

## 2017-05-03 MED FILL — DEXTROAMP-AMP 30 MG TABLET: 30 | 30 days supply | Qty: 30 | Fill #0

## 2017-05-03 MED FILL — ADDERALL XR 30 MG CAP SA: 30 | 30 days supply | Qty: 30 | Fill #0

## 2017-06-08 MED FILL — AMPHETAMINE SALTS 30 MG TAB: 30 | 30 days supply | Qty: 30 | Fill #0

## 2017-06-08 MED FILL — ADDERALL XR 30 MG CAP SA: 30 | 30 days supply | Qty: 30 | Fill #0

## 2017-08-29 ENCOUNTER — Encounter: Payer: Self-pay | Admitting: Family Medicine

## 2017-08-29 ENCOUNTER — Ambulatory Visit (INDEPENDENT_AMBULATORY_CARE_PROVIDER_SITE_OTHER): Payer: 59 | Admitting: Family Medicine

## 2017-08-29 VITALS — BP 120/80 | HR 90 | Temp 98.3°F | Ht 69.0 in | Wt 207.4 lb

## 2017-08-29 DIAGNOSIS — F17200 Nicotine dependence, unspecified, uncomplicated: Secondary | ICD-10-CM | POA: Diagnosis not present

## 2017-08-29 DIAGNOSIS — F900 Attention-deficit hyperactivity disorder, predominantly inattentive type: Secondary | ICD-10-CM | POA: Diagnosis not present

## 2017-08-29 DIAGNOSIS — Z79899 Other long term (current) drug therapy: Secondary | ICD-10-CM | POA: Diagnosis not present

## 2017-08-29 MED ORDER — AMPHETAMINE-DEXTROAMPHETAMINE 30 MG PO TABS: 30.0000 mg | ORAL_TABLET | Freq: Every day | ORAL | 0 refills | Status: DC

## 2017-08-29 MED ORDER — AMPHETAMINE-DEXTROAMPHET ER 30 MG PO CP24: 30.0000 mg | ORAL_CAPSULE | Freq: Every day | ORAL | 0 refills | Status: DC

## 2017-08-29 NOTE — Patient Instructions (Addendum)
Thank you for coming in to see us today. Please see below to review our plan for today's visit.  1. I have given you refills of your Adderall for the next 3 months. I would like to see you every 3 months while on this medication. 2. There are several options for smoking cessation to consider. I will discuss these with her fianc. If she has a physician, she can consider smoking cessation. Otherwise she can establish here at her clinic. Below is information on the medication we discussed called Chantix. You can also call  1-800-QUIT-NOW in the meantime for free resources.  Please call the clinic at [phone] if your symptoms worsen or you have any concerns. It was my pleasure to see you. -- Durward Parcelavid McMullen, DO [facility], PGY-2  Varenicline oral tablets What is this medicine? VARENICLINE (var EN i kleen) is used to help people quit smoking. It can reduce the symptoms caused by stopping smoking. It is used with a patient support program recommended by your physician. This medicine may be used for other purposes; ask your health care provider or pharmacist if you have questions. COMMON BRAND NAME(S): Chantix What should I tell my health care provider before I take this medicine? They need to know if you have any of these conditions: -bipolar disorder, depression, schizophrenia or other mental illness -heart disease -if you often drink alcohol -kidney disease -peripheral vascular disease -seizures -stroke -suicidal thoughts, plans, or attempt; a previous suicide attempt by you or a family member -an unusual or allergic reaction to varenicline, other medicines, foods, dyes, or preservatives -pregnant or trying to get pregnant -breast-feeding How should I use this medicine? Take this medicine by mouth after eating. Take with a full glass of water. Follow the directions on the prescription label. Take your doses at regular intervals. Do not take your medicine more often  than directed. There are 3 ways you can use this medicine to help you quit smoking; talk to your health care professional to decide which plan is right for you: 1) you can choose a quit date and start this medicine 1 week before the quit date, or, 2) you can start taking this medicine before you choose a quit date, and then pick a quit date between day 8 and 35 days of treatment, or, 3) if you are not sure that you are able or willing to quit smoking right away, start taking this medicine and slowly decrease the amount you smoke as directed by your health care professional with the goal of being cigarette-free by week 12 of treatment. Stick to your plan; ask about support groups or other ways to help you remain cigarette-free. If you are motivated to quit smoking and did not succeed during a previous attempt with this medicine for reasons other than side effects, or if you returned to smoking after this treatment, speak with your health care professional about whether another course of this medicine may be right for you. A special MedGuide will be given to you by the pharmacist with each prescription and refill. Be sure to read this information carefully each time. Talk to your pediatrician regarding the use of this medicine in children. This medicine is not approved for use in children. Overdosage: If you think you have taken too much of this medicine contact a poison control center or emergency room at once. NOTE: This medicine is only for you. Do not share this medicine with others. What if I miss a dose?  If you miss a dose, take it as soon as you can. If it is almost time for your next dose, take only that dose. Do not take double or extra doses. What may interact with this medicine? -alcohol or any product that contains alcohol -insulin -other stop smoking aids -theophylline -warfarin This list may not describe all possible interactions. Give your health care provider a list of all the  medicines, herbs, non-prescription drugs, or dietary supplements you use. Also tell them if you smoke, drink alcohol, or use illegal drugs. Some items may interact with your medicine. What should I watch for while using this medicine? Visit your doctor or health care professional for regular check ups. Ask for ongoing advice and encouragement from your doctor or healthcare professional, friends, and family to help you quit. If you smoke while on this medication, quit again Your mouth may get dry. Chewing sugarless gum or sucking hard candy, and drinking plenty of water may help. Contact your doctor if the problem does not go away or is severe. You may get drowsy or dizzy. Do not drive, use machinery, or do anything that needs mental alertness until you know how this medicine affects you. Do not stand or sit up quickly, especially if you are an older patient. This reduces the risk of dizzy or fainting spells. Sleepwalking can happen during treatment with this medicine, and can sometimes lead to behavior that is harmful to you, other people, or property. Stop taking this medicine and tell your doctor if you start sleepwalking or have other unusual sleep-related activity. Decrease the amount of alcoholic beverages that you drink during treatment with this medicine until you know if this medicine affects your ability to tolerate alcohol. Some people have experienced increased drunkenness (intoxication), unusual or sometimes aggressive behavior, or no memory of things that have happened (amnesia) during treatment with this medicine. The use of this medicine may increase the chance of suicidal thoughts or actions. Pay special attention to how you are responding while on this medicine. Any worsening of mood, or thoughts of suicide or dying should be reported to your health care professional right away. What side effects may I notice from receiving this medicine? Side effects that you should report to your doctor  or health care professional as soon as possible: -allergic reactions like skin rash, itching or hives, swelling of the face, lips, tongue, or throat -acting aggressive, being angry or violent, or acting on dangerous impulses -breathing problems -changes in vision -chest pain or chest tightness -confusion, trouble speaking or understanding -new or worsening depression, anxiety, or panic attacks -extreme increase in activity and talking (mania) -fast, irregular heartbeat -feeling faint or lightheaded, falls -fever -pain in legs when walking -problems with balance, talking, walking -redness, blistering, peeling or loosening of the skin, including inside the mouth -ringing in ears -seeing or hearing things that aren't there (hallucinations) -seizures -sleepwalking -sudden numbness or weakness of the face, arm or leg -thoughts about suicide or dying, or attempts to commit suicide -trouble passing urine or change in the amount of urine -unusual bleeding or bruising -unusually weak or tired Side effects that usually do not require medical attention (report to your doctor or health care professional if they continue or are bothersome): -constipation -headache -nausea, vomiting -strange dreams -stomach gas -trouble sleeping This list may not describe all possible side effects. Call your doctor for medical advice about side effects. You may report side effects to FDA at 1-800-FDA-1088. Where should I keep my medicine?  Keep out of the reach of children. Store at room temperature between 15 and 30 degrees C (59 and 86 degrees F). Throw away any unused medicine after the expiration date. NOTE: This sheet is a summary. It may not cover all possible information. If you have questions about this medicine, talk to your doctor, pharmacist, or health care provider.  2018 Elsevier/Gold Standard (2015-08-19 16:14:23)

## 2017-08-29 NOTE — Progress Notes (Signed)
Subjective:   Patient ID: Danny Abrahamndrew T Ryall    DOB: Jul 11, 1986, 31 y.o. male   MRN: [id number]  CC: "Medication refill"  HPI: Danny Edwards is a 31 y.o. male who presents to clinic today for medication refill. Problems discussed today are as follows:  ADHD: Diagnosed late in ninth grade. Has been on stable regimen since 12th grade. Currently taking Adderall 30 mg extended release in the morning followed by a 30 mg tablet in the evening. Patient pleased with control of symptoms including ability to focus and remain organized. 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 ROS: Denies feelings of anxiety or depression.  Smoking: Current every day smoker with 1 pack per day since age 31. No history of previous attempts to stop. Neysa BonitoFianc is also an every day smoker and is interested in cessation. ROS: Denies fevers or chills, chest pain, shortness of breath, cough, feelings of fatigue.  Complete ROS performed, see HPI for pertinent.  PMFSH: Back to use disorder, ADHD, anxiety, history of substance abuse. Surgical history knee. Family history none. Smoking status reviewed. Medications reviewed.  Objective:   BP 120/80   Pulse 90   Temp 98.3 F (36.8 C) (Oral)   Ht 5\' 9"  (1.753 m)   Wt 207 lb 6.4 oz (94.1 kg)   SpO2 99%   BMI 30.63 kg/m  Vitals and nursing note reviewed.  General: well nourished, well developed, in no acute distress with non-toxic appearance HEENT: normocephalic, atraumatic, moist mucous membranes CV: regular rate and rhythm without murmurs, rubs, or gallops, no lower extremity edema Lungs: clear to auscultation bilaterally with normal work of breathing Skin: warm, dry, no rashes or lesions, cap refill < 2 seconds Extremities: warm and well perfused, normal tone Psych: euthymic mood congruent affect, nonanxious, calm demeanor  Assessment & Plan:   Attention deficit hyperactivity disorder (ADHD) Chronic. Controlled  with Adderall. Able to function with stable regimen for many years. Database reviewed without signs of red flags. --Refill provided for 3 month supply of Adderall XR 30 mg in a.m. daily along with Adderall 30 mg daily in p.m. --UDS collected, patient endorsing THC use recently on vacation --Controlled substance contract signed  Tobacco use disorder Chronic. Interested in cessation. No prior attempts. --Provided patient information on Chantix and 1-800-QUIT-NOW --Strict patient to discuss with fianc who also smokes to decide on quit date --RTC one month  Orders Placed This Encounter  Procedures  . ToxASSURE Select 13 (MW), Urine   Meds ordered this encounter  Medications  . DISCONTD: amphetamine-dextroamphetamine (ADDERALL XR) 30 MG 24 hr capsule    Sig: Take 1 capsule (30 mg total) by mouth daily.    Dispense:  30 capsule    Refill:  0    Do not fill until 08/30/17  . DISCONTD: amphetamine-dextroamphetamine (ADDERALL) 30 MG tablet    Sig: Take 1 tablet by mouth daily.    Dispense:  30 tablet    Refill:  0    Do not fill until 08/30/17  . DISCONTD: amphetamine-dextroamphetamine (ADDERALL) 30 MG tablet    Sig: Take 1 tablet by mouth daily.    Dispense:  30 tablet    Refill:  0    Do not fill until 09/30/17  . DISCONTD: amphetamine-dextroamphetamine (ADDERALL XR) 30 MG 24 hr capsule    Sig: Take 1 capsule (30 mg total) by mouth daily.    Dispense:  30 capsule    Refill:  0    Do not fill until 09/30/17  . amphetamine-dextroamphetamine (ADDERALL) 30 MG tablet    Sig: Take 1 tablet by mouth daily.    Dispense:  30 tablet    Refill:  0    Do not fill until 10/31/17  . amphetamine-dextroamphetamine (ADDERALL XR) 30 MG 24 hr capsule    Sig: Take 1 capsule (30 mg total) by mouth daily.    Dispense:  30 capsule    Refill:  0    Do not fill until 10/31/17    Durward Parcel, DO [facility], PGY-2 08/29/2017 3:59 PM

## 2017-08-29 NOTE — Assessment & Plan Note (Addendum)
Chronic. Controlled with Adderall. Able to function with stable regimen for many years. Database reviewed without signs of red flags. --Refill provided for 3 month supply of Adderall XR 30 mg in a.m. daily along with Adderall 30 mg daily in p.m. --UDS collected, patient endorsing THC use recently on vacation --Controlled substance contract signed

## 2017-08-29 NOTE — Assessment & Plan Note (Addendum)
Chronic. Interested in cessation. No prior attempts. --Provided patient information on Chantix and 1-800-QUIT-NOW --Strict patient to discuss with fianc who also smokes to decide on quit date --RTC one month

## 2017-08-30 MED FILL — ADDERALL XR 30 MG CAP SA: 30 | 30 days supply | Qty: 30 | Fill #0

## 2017-08-30 MED FILL — AMPHETAMINE SALTS 30 MG TAB: 30 | 30 days supply | Qty: 30 | Fill #0

## 2017-09-06 LAB — TOXASSURE SELECT 13 (MW), URINE

## 2017-10-04 MED FILL — AMPHETAMINE SALTS 30 MG TAB: 30 | 30 days supply | Qty: 30 | Fill #0

## 2017-10-04 MED FILL — ADDERALL XR 30 MG CAP SA: 30 | 30 days supply | Qty: 30 | Fill #0

## 2017-11-09 MED FILL — DEXTROAMP-AMPHETAMIN 30 MG: 30 | 30 days supply | Qty: 30 | Fill #0

## 2017-11-09 MED FILL — ADDERALL XR 30 MG CAP SA: 30 | 30 days supply | Qty: 30 | Fill #0

## 2017-12-31 ENCOUNTER — Telehealth: Payer: Self-pay | Admitting: Family Medicine

## 2017-12-31 DIAGNOSIS — F900 Attention-deficit hyperactivity disorder, predominantly inattentive type: Secondary | ICD-10-CM

## 2017-12-31 NOTE — Telephone Encounter (Signed)
Pt has an appt 01-25-18 for med refills but will run out of his medication before then. Please refill his adderal to last until the 8th.  Pharmacy is [facility]

## 2018-01-01 MED ORDER — AMPHETAMINE-DEXTROAMPHET ER 30 MG PO CP24: 30.0000 mg | ORAL_CAPSULE | Freq: Every day | ORAL | 0 refills | Status: DC

## 2018-01-01 MED FILL — ADDERALL XR 30 MG CAP SA: 30 | 30 days supply | Qty: 30 | Fill #0

## 2018-01-02 ENCOUNTER — Other Ambulatory Visit: Payer: Self-pay | Admitting: Family Medicine

## 2018-01-02 DIAGNOSIS — F900 Attention-deficit hyperactivity disorder, predominantly inattentive type: Secondary | ICD-10-CM

## 2018-01-02 MED ORDER — AMPHETAMINE-DEXTROAMPHETAMINE 30 MG PO TABS: 30.0000 mg | ORAL_TABLET | Freq: Every day | ORAL | 0 refills | Status: DC

## 2018-01-02 NOTE — Telephone Encounter (Signed)
Please refill the other adderal RX.  [facility]

## 2018-01-03 MED FILL — AMPHETAMINE SALTS 30 MG TAB: 30 | 30 days supply | Qty: 30 | Fill #0

## 2018-01-25 ENCOUNTER — Ambulatory Visit: Payer: 59 | Admitting: Family Medicine

## 2018-01-29 NOTE — Progress Notes (Deleted)
   Subjective   Patient ID: Danny Edwards    DOB: [birth year], 32 y.o. male   MRN: [id number]  CC: "***"  HPI: Danny Abrahamndrew T Tamas is a 32 y.o. male who presents to clinic today for the following:  ***: ***  ***Last seen by me on 08/2017 for Adderall XR regular refill.  UDS obtained with patient endorsing THC use.  Patient undecided about quitting tobacco use.  Overdue for tetanus booster and flu vaccine.  ROS: see HPI for pertinent.  PMFSH: Tobacco use disorder, ADHD, anxiety, history of substance abuse. Surgical history knee. Family history unremarkable. Smoking status reviewed. Medications reviewed.  Objective   There were no vitals taken for this visit. Vitals and nursing note reviewed.  General: well nourished, well developed, NAD with non-toxic appearance HEENT: normocephalic, atraumatic, moist mucous membranes Neck: supple, non-tender without lymphadenopathy Cardiovascular: regular rate and rhythm without murmurs, rubs, or gallops Lungs: clear to auscultation bilaterally with normal work of breathing Abdomen: soft, non-tender, non-distended, normoactive bowel sounds Skin: warm, dry, no rashes or lesions, cap refill < 2 seconds Extremities: warm and well perfused, normal tone, no edema  Assessment & Plan   No problem-specific Assessment & Plan notes found for this encounter.  No orders of the defined types were placed in this encounter.  No orders of the defined types were placed in this encounter.   Durward Parcelavid McMullen, DO [facility], PGY-2 01/29/2018, 5:40 PM

## 2018-01-30 ENCOUNTER — Ambulatory Visit: Payer: 59 | Admitting: Family Medicine

## 2018-02-12 ENCOUNTER — Encounter: Payer: Self-pay | Admitting: Family Medicine

## 2018-02-12 ENCOUNTER — Ambulatory Visit (INDEPENDENT_AMBULATORY_CARE_PROVIDER_SITE_OTHER): Payer: 59 | Admitting: Family Medicine

## 2018-02-12 ENCOUNTER — Other Ambulatory Visit: Payer: Self-pay

## 2018-02-12 VITALS — BP 110/70 | HR 100 | Temp 98.3°F | Ht 69.0 in | Wt 221.0 lb

## 2018-02-12 DIAGNOSIS — F17200 Nicotine dependence, unspecified, uncomplicated: Secondary | ICD-10-CM | POA: Diagnosis not present

## 2018-02-12 DIAGNOSIS — F900 Attention-deficit hyperactivity disorder, predominantly inattentive type: Secondary | ICD-10-CM | POA: Diagnosis not present

## 2018-02-12 MED ORDER — AMPHETAMINE-DEXTROAMPHET ER 30 MG PO CP24: 30.0000 mg | ORAL_CAPSULE | Freq: Every day | ORAL | 0 refills | Status: DC

## 2018-02-12 MED ORDER — VARENICLINE TARTRATE 1 MG PO TABS: 1.0000 mg | ORAL_TABLET | Freq: Two times a day (BID) | ORAL | 0 refills | Status: DC

## 2018-02-12 MED ORDER — AMPHETAMINE-DEXTROAMPHETAMINE 30 MG PO TABS: 30.0000 mg | ORAL_TABLET | Freq: Every day | ORAL | 0 refills | Status: DC

## 2018-02-12 MED ORDER — VARENICLINE TARTRATE 0.5 MG X 11 & 1 MG X 42 PO MISC: ORAL | 0 refills | Status: DC

## 2018-02-12 MED FILL — ADDERALL XR 30 MG CAP SA: 30 | 90 days supply | Qty: 90 | Fill #0

## 2018-02-12 MED FILL — DEXTROAMP-AMPHETAMIN 15 MG: 15 | 90 days supply | Qty: 180 | Fill #0

## 2018-02-12 NOTE — Patient Instructions (Signed)
Thank you for coming in to see Danny Edwards today. Please see below to review our plan for today's visit.  1.  I am pleased to hear about your job promotion your upcoming wedding!  I sent in the refills for your Adderall for a 3 month supply.  These should be ready for pickup after you leave the office.  We will see you again in 3 months. 2.  I am also pleased to hear that you want to stop smoking!  I have given you 2 prescriptions for Chantix.  Begin with the starter pack and continue with the maintenance pack.  Be sure to discontinue all cigarettes 1 week after initiating.  Please call the clinic at [phone] if your symptoms worsen or you have any concerns. It was our pleasure to serve you.  Durward Parcelavid Aadith Raudenbush, DO [facility], PGY-2

## 2018-02-12 NOTE — Progress Notes (Signed)
Subjective   Patient ID: Danny Edwards    DOB: Mar 07, 1986, 32 y.o. male   MRN: [id number]  CC: "Med refill"  HPI: Danny Edwards is a 32 y.o. male who presents to clinic today for the following:  ADHD: Patient has been off of his meds past couple of days since completing his refill the end of the year.  He states that he is doing well on the medication and is here today for his 15-month refill.  Patient is able to concentrate and focus better while on this medication.  He is excited about his new job promotion and is hopeful that his medications will help him perform.  Smoking: Patient has a 13-pack-year history.  He is a current everyday smoker with cigarettes only.  He is interested in cessation.  Patient has no prior attempts of cessation.  His wife is currently undergoing surgery today and is also planning on smoking cessation in 1 month.  Patient interested in Chantix which his wife will be starting.  ROS: see HPI for pertinent.  PMFSH: Tobacco use disorder, ADHD, anxiety, history of substance abuse. Surgical history knee. Family history unremarkable. Smoking status reviewed. Medications reviewed.  Objective   BP 110/70   Pulse 100   Temp 98.3 F (36.8 C) (Oral)   Ht 5\' 9"  (1.753 m)   Wt 221 lb (100.2 kg)   SpO2 98%   BMI 32.64 kg/m  Vitals and nursing note reviewed.  General: well nourished, well developed, NAD with non-toxic appearance HEENT: normocephalic, atraumatic, moist mucous membranes Neck: supple, non-tender without lymphadenopathy Cardiovascular: regular rate and rhythm without murmurs, rubs, or gallops Lungs: clear to auscultation bilaterally with normal work of breathing Abdomen: soft, non-tender, non-distended, normoactive bowel sounds Skin: warm, dry, no rashes or lesions, cap refill < 2 seconds Extremities: warm and well perfused, normal tone, no edema Psych: euthymic mood, congruent affect, slightly anxious in appearence  Assessment & Plan   Attention  deficit hyperactivity disorder (ADHD) Chronic.  Well-controlled with regular and long-acting Adderall use since high school.  Currently without Adderall with signs of hyperactivity though mild in severity. - Refill 49-month supply of Adderall XR 30 mg daily in the morning along with Adderall 30 mg daily in the evening  Tobacco use disorder Chronic.  Current everyday smoker.  Has 10 pack years of cigarettes only.  No prior attempts to stop smoking.  Interested in cessation today.  Appears to be good candidate along with you fianc who plans on cessation in 1 month.  Patient interested in Chantix. - Given prescription for starter pack and maintenance dose for Chantix with instructions to take in 1 month along with her fianc - RTC 3 months  No orders of the defined types were placed in this encounter.  Meds ordered this encounter  Medications  . amphetamine-dextroamphetamine (ADDERALL XR) 30 MG 24 hr capsule    Sig: Take 1 capsule (30 mg total) by mouth daily.    Dispense:  90 capsule    Refill:  0  . amphetamine-dextroamphetamine (ADDERALL) 30 MG tablet    Sig: Take 1 tablet by mouth daily.    Dispense:  90 tablet    Refill:  0  . varenicline (CHANTIX STARTING MONTH PAK) 0.5 MG X 11 & 1 MG X 42 tablet    Sig: Take one 0.5 mg tablet by mouth once daily for 3 days, then increase to one 0.5 mg tablet twice daily for 4 days, then increase to one 1 mg  tablet twice daily.    Dispense:  53 tablet    Refill:  0  . varenicline (CHANTIX CONTINUING MONTH PAK) 1 MG tablet    Sig: Take 1 tablet (1 mg total) by mouth 2 (two) times daily.    Dispense:  60 tablet    Refill:  0    Durward Parcelavid Damary Doland, DO [facility], PGY-2 02/12/2018, 12:28 PM

## 2018-02-12 NOTE — Assessment & Plan Note (Addendum)
Chronic.  Well-controlled with regular and long-acting Adderall use since high school.  Currently without Adderall with signs of hyperactivity though mild in severity. - Refill 5831-month supply of Adderall XR 30 mg daily in the morning along with Adderall 30 mg daily in the evening

## 2018-02-12 NOTE — Assessment & Plan Note (Addendum)
Chronic.  Current everyday smoker.  Has 10 pack years of cigarettes only.  No prior attempts to stop smoking.  Interested in cessation today.  Appears to be good candidate along with you fianc who plans on cessation in 1 month.  Patient interested in Chantix. - Given prescription for starter pack and maintenance dose for Chantix with instructions to take in 1 month along with her fianc - RTC 3 months

## 2018-03-06 MED FILL — CHANTIX STARTING MONTH BOX: 0.5 MG X 11 | 30 days supply | Qty: 53 | Fill #0

## 2018-06-24 ENCOUNTER — Encounter: Payer: Self-pay | Admitting: Family Medicine

## 2018-06-24 ENCOUNTER — Ambulatory Visit (INDEPENDENT_AMBULATORY_CARE_PROVIDER_SITE_OTHER): Payer: 59 | Admitting: Family Medicine

## 2018-06-24 VITALS — BP 130/90 | HR 112 | Temp 98.2°F | Wt 223.8 lb

## 2018-06-24 DIAGNOSIS — F900 Attention-deficit hyperactivity disorder, predominantly inattentive type: Secondary | ICD-10-CM

## 2018-06-24 DIAGNOSIS — Z79899 Other long term (current) drug therapy: Secondary | ICD-10-CM | POA: Diagnosis not present

## 2018-06-24 DIAGNOSIS — Z5181 Encounter for therapeutic drug level monitoring: Secondary | ICD-10-CM

## 2018-06-24 DIAGNOSIS — F17200 Nicotine dependence, unspecified, uncomplicated: Secondary | ICD-10-CM | POA: Diagnosis not present

## 2018-06-24 MED ORDER — AMPHETAMINE-DEXTROAMPHET ER 30 MG PO CP24: 30.0000 mg | ORAL_CAPSULE | Freq: Every day | ORAL | 0 refills | Status: DC

## 2018-06-24 MED ORDER — AMPHETAMINE-DEXTROAMPHETAMINE 30 MG PO TABS: 30.0000 mg | ORAL_TABLET | Freq: Every day | ORAL | 0 refills | Status: DC

## 2018-06-24 MED ORDER — AMPHETAMINE-DEXTROAMPHET ER 30 MG PO CP24
30.0000 mg | ORAL_CAPSULE | Freq: Every day | ORAL | 0 refills | Status: DC
Start: 2018-06-24 — End: 2018-11-25

## 2018-06-24 MED FILL — ADDERALL XR 30 MG CAP SA: 30 | 30 days supply | Qty: 30 | Fill #0

## 2018-06-24 MED FILL — AMPHETAMINE SALTS 30 MG TAB: 30 | 30 days supply | Qty: 30 | Fill #0

## 2018-06-24 NOTE — Assessment & Plan Note (Addendum)
Chronic.  Database reviewed without red flags.  Patient does have history of marijuana use and admits to this. - Checking UDS - Given electronic prescription for Adderall XR 30 mg #30 tablets, 2 refills and regular Adderall 30 mg #30 tablets, 2 refills - Discussed importance of avoiding marijuana use - RTC 3 months

## 2018-06-24 NOTE — Patient Instructions (Signed)
Thank you for coming in to see us today. Please see below to review our plan for today's visit.  1.  I have given you a 6770-month supply of your Adderall.  Take as prescribed.  We can discuss your urine results at your next visit.  It is very important that you not use any illicit substances while on this controlled education.  This breaks the contract that you signed with me.  In addition, this may help with some of your anxiety but will cause rebound symptoms and dependence of the medication to control his symptoms. 2.  I am pleased to note that you are motivated to stop smoking.  Take the Chantix when you are ready.  You can arrange for your fianc to be seen at our clinic if needed.  Do know there may be a wait list.  Please call the clinic at [phone] if your symptoms worsen or you have any concerns. It was our pleasure to serve you.  Durward Parcelavid Anaika Santillano, DO [facility], PGY-2

## 2018-06-24 NOTE — Progress Notes (Signed)
Subjective   Patient ID: Danny Edwards    DOB: 1986/05/02, 32 y.o. male   MRN: [id number]  CC: "Medication refill"  HPI: Danny Edwards is a 32 y.o. male who presents to clinic today for the following:  ADHD: Patient reports controlled symptoms with use of Adderall XR 30 mg daily in the morning and Adderall regular 30 mg in the evening.  He is here today for refill for this chronic medication.  Patient does endorse occasional marijuana use for anxiety and symptoms of "IBS."  He feels that he is under stress right now with his job and is anxious about getting married in October.  He denies use of other illicit drugs or alcohol use.  Patient denies chest pain, shortness of breath, palpitations.  Tobacco use disorder: Continues to smoke daily.  Has prescription for Chantix given to him by me during his last office visit but has not started because he is waiting for his fiance to receive a prescription for the same medication but she has not been able to schedule appointment with a physician yet.  Patient is motivated to stop smoking prior to his wedding in October.  Patient denies cough, fevers or chills, wheeze  ROS: see HPI for pertinent.  PMFSH: Tobacco use disorder, ADHD, anxiety, history of substance abuse.Surgical history knee.Family history unremarkable. Smoking status reviewed. Medications reviewed.  Objective   BP 130/90   Pulse (!) 112   Temp 98.2 F (36.8 C) (Oral)   Wt 223 lb 12.8 oz (101.5 kg)   SpO2 100%   BMI 33.05 kg/m  Vitals and nursing note reviewed.  General: calm and pleasant, well nourished, well developed, NAD with non-toxic appearance HEENT: normocephalic, atraumatic, moist mucous membranes Neck: supple, non-tender without lymphadenopathy Cardiovascular: regular rate and rhythm without murmurs, rubs, or gallops Lungs: clear to auscultation bilaterally with normal work of breathing Abdomen: soft, non-tender, non-distended, normoactive bowel sounds Skin:  warm, dry, no rashes or lesions, cap refill < 2 seconds Extremities: warm and well perfused, normal tone, no edema Psych: euthymic mood, congruent affect  Assessment & Plan   Tobacco use disorder Chronic.  Current everyday smoker.  Has Chantix though not ready to begin until fianc has a smoking cessation plan.  Appears to be a good candidate. - Instructed patient to have fianc establish care to begin smoking cessation patient is ready  Attention deficit hyperactivity disorder (ADHD) Chronic.  Database reviewed without red flags.  Patient does have history of marijuana use and admits to this. - Checking UDS - Given electronic prescription for Adderall XR 30 mg #30 tablets, 2 refills and regular Adderall 30 mg #30 tablets, 2 refills - Discussed importance of avoiding marijuana use - RTC 3 months  Orders Placed This Encounter  Procedures  . ToxASSURE Select 13 (MW), Urine   Meds ordered this encounter  Medications  . amphetamine-dextroamphetamine (ADDERALL) 30 MG tablet    Sig: Take 1 tablet by mouth daily before breakfast.    Dispense:  30 tablet    Refill:  0  . amphetamine-dextroamphetamine (ADDERALL) 30 MG tablet    Sig: Take 1 tablet by mouth daily before breakfast.    Dispense:  30 tablet    Refill:  0    DO NOT FILL BEFORE 07/25/2018.  Marland Kitchen amphetamine-dextroamphetamine (ADDERALL) 30 MG tablet    Sig: Take 1 tablet by mouth daily before breakfast.    Dispense:  30 tablet    Refill:  0    DO NOT FILL  BEFORE 08/25/2018.  Marland Kitchen. amphetamine-dextroamphetamine (ADDERALL XR) 30 MG 24 hr capsule    Sig: Take 1 capsule (30 mg total) by mouth daily.    Dispense:  30 capsule    Refill:  0  . amphetamine-dextroamphetamine (ADDERALL XR) 30 MG 24 hr capsule    Sig: Take 1 capsule (30 mg total) by mouth daily.    Dispense:  30 capsule    Refill:  0    DO NOT FILL BEFORE 07/25/2018.  Marland Kitchen. amphetamine-dextroamphetamine (ADDERALL XR) 30 MG 24 hr capsule    Sig: Take 1 capsule (30 mg total) by  mouth daily.    Dispense:  30 capsule    Refill:  0    DO NOT FILL BEFORE 08/25/2018.    Durward Parcelavid McMullen, DO [facility], PGY-3 06/24/2018, 12:33 PM

## 2018-06-24 NOTE — Assessment & Plan Note (Signed)
Chronic.  Current everyday smoker.  Has Chantix though not ready to begin until fianc has a smoking cessation plan.  Appears to be a good candidate. - Instructed patient to have fianc establish care to begin smoking cessation patient is ready

## 2018-06-27 LAB — TOXASSURE SELECT 13 (MW), URINE

## 2018-07-25 MED FILL — ADDERALL XR 30 MG CAP SA: 30 | 30 days supply | Qty: 30 | Fill #0

## 2018-07-25 MED FILL — DEXTROAMPH TB 30MG NSTR 100: 30 | 30 days supply | Qty: 30 | Fill #0

## 2018-07-27 ENCOUNTER — Encounter: Payer: Self-pay | Admitting: Family Medicine

## 2018-09-12 MED FILL — AMPHETAMINE SALTS 30 MG TAB: 30 | 30 days supply | Qty: 30 | Fill #0

## 2018-09-12 MED FILL — ADDERALL XR 30 MG CAP SA: 30 | 30 days supply | Qty: 30 | Fill #0

## 2018-10-21 ENCOUNTER — Other Ambulatory Visit: Payer: Self-pay | Admitting: Family Medicine

## 2018-10-29 ENCOUNTER — Encounter: Payer: Self-pay | Admitting: Family Medicine

## 2018-10-29 ENCOUNTER — Other Ambulatory Visit: Payer: Self-pay | Admitting: Family Medicine

## 2018-10-29 DIAGNOSIS — F900 Attention-deficit hyperactivity disorder, predominantly inattentive type: Secondary | ICD-10-CM

## 2018-10-29 NOTE — Telephone Encounter (Signed)
Pt scheduled an appointment with Dr. Abelardo DieselMcmullen for his medication refills. Pt is completely out of his medications. He would like to know if Dr. Abelardo DieselMcmullen can send in a refill to last him until his appointment on 11/25/18.

## 2018-10-30 MED ORDER — AMPHETAMINE-DEXTROAMPHET ER 30 MG PO CP24: 30.0000 mg | ORAL_CAPSULE | Freq: Every day | ORAL | 0 refills | Status: DC

## 2018-10-30 MED ORDER — AMPHETAMINE-DEXTROAMPHETAMINE 30 MG PO TABS: 30.0000 mg | ORAL_TABLET | Freq: Every day | ORAL | 0 refills | Status: DC

## 2018-10-30 MED FILL — ADDERALL XR 30 MG CAP SA: 30 | 30 days supply | Qty: 30 | Fill #0

## 2018-10-30 MED FILL — AMPHETAMINE SALTS 30 MG TAB: 30 | 30 days supply | Qty: 30 | Fill #0

## 2018-11-25 ENCOUNTER — Ambulatory Visit (INDEPENDENT_AMBULATORY_CARE_PROVIDER_SITE_OTHER): Payer: 59 | Admitting: Family Medicine

## 2018-11-25 ENCOUNTER — Encounter: Payer: Self-pay | Admitting: Family Medicine

## 2018-11-25 ENCOUNTER — Other Ambulatory Visit: Payer: Self-pay

## 2018-11-25 VITALS — BP 122/68 | HR 80 | Temp 98.3°F | Ht 69.0 in | Wt 233.0 lb

## 2018-11-25 DIAGNOSIS — F17210 Nicotine dependence, cigarettes, uncomplicated: Secondary | ICD-10-CM

## 2018-11-25 DIAGNOSIS — F900 Attention-deficit hyperactivity disorder, predominantly inattentive type: Secondary | ICD-10-CM

## 2018-11-25 DIAGNOSIS — F17200 Nicotine dependence, unspecified, uncomplicated: Secondary | ICD-10-CM

## 2018-11-25 MED ORDER — AMPHETAMINE-DEXTROAMPHETAMINE 30 MG PO TABS: 30.0000 mg | ORAL_TABLET | Freq: Every day | ORAL | 0 refills | Status: DC

## 2018-11-25 MED ORDER — AMPHETAMINE-DEXTROAMPHET ER 30 MG PO CP24: 30.0000 mg | ORAL_CAPSULE | Freq: Every day | ORAL | 0 refills | Status: DC

## 2018-11-25 NOTE — Assessment & Plan Note (Signed)
Chronic.  Motivated to begin cessation along with wife. - Set goal to quit with Chantix use beginning 12/18/2018 - RTC 1 month

## 2018-11-25 NOTE — Assessment & Plan Note (Addendum)
Chronic.  Well-controlled with Adderall.  Would benefit from gradual wean, however not ideal with planned smoking cessation in the near future.  PWP reviewed. - Given electronic prescription for 30 tablets of Adderall XR 3 mg daily and 30 tablets of regular Adderall 30 mg daily with 2 refills - RTC 3 months for controlled substance refill

## 2018-11-25 NOTE — Patient Instructions (Signed)
Thank you for coming in to see Danny Edwards today. Please see below to review our plan for today's visit.  1.  Start the Chantix on January 1 and stop smoking on January 8.  It is very important that you smoke no cigarettes or use any tobacco products at this point.  We will continue your Chantix as we gradually increase the dose throughout the course of about a month.  If you are able to do this, please schedule an appointment later in January to follow-up with me to we can monitor progress. 2.  Down the road I do think it would be advantageous to work towards decreasing your Adderall use.  We will work on your smoking first.  Please call the clinic at [phone] if your symptoms worsen or you have any concerns. It was our pleasure to serve you.  Durward Parcelavid Rhapsody Wolven, DO [facility], PGY-3

## 2018-11-25 NOTE — Progress Notes (Signed)
Subjective   Patient ID: Danny Edwards    DOB: 07-07-1986, 32 y.o. male   MRN: [id number]  CC: "Medication refill"  HPI: Danny Edwards is a 32 y.o. male who presents to clinic today for the following:  ADHD: Patient with longstanding ADHD well controlled with 30 mg of Adderall extended release in the morning and 30 mg of regular in the afternoon.  Patient here today for refill request.  He did receive a dose for the last month back in November which should expire in 3 days.  Patient feels that he is doing well on the current regimen but has not consider decreasing his dose in the past.  He is open to tapering off Adderall but would like to first work on his tobacco cessation today.  He does feel that it helps him concentrate better while at work and notices that it is difficult for him to stay focused while off the medication.  Smoking: Patient has longstanding use of cigarettes.  He currently has Chantix which was given to him several months ago but has not started because he was waiting for his fiance who is now his wife to start the medication.  He would like to start the medication going into the new years.  He denies cough, sputum production, wheeze, shortness of breath or chest pain.  ROS: see HPI for pertinent.  PMFSH: Tobacco use disorder, ADHD, anxiety, history of substance abuse.Surgical history knee.Family history unremarkable. Smoking status reviewed. Medications reviewed.  Objective   BP 122/68 (BP Location: Left Arm, Patient Position: Sitting, Cuff Size: Large)   Pulse 80   Temp 98.3 F (36.8 C) (Oral)   Ht 5\' 9"  (1.753 m)   Wt 233 lb (105.7 kg)   SpO2 99%   BMI 34.41 kg/m  Vitals and nursing note reviewed.  General: well nourished, well developed, NAD with non-toxic appearance HEENT: normocephalic, atraumatic, moist mucous membranes Cardiovascular: regular rate and rhythm without murmurs, rubs, or gallops Lungs: clear to auscultation bilaterally with normal work  of breathing Abdomen: soft, non-tender, non-distended, normoactive bowel sounds Skin: warm, dry, no rashes or lesions, cap refill < 2 seconds Extremities: warm and well perfused, normal tone, no edema Psych: euthymic mood, congruent affect  Assessment & Plan   ADHD, predominantly inattentive type Chronic.  Well-controlled with Adderall.  Would benefit from gradual wean, however not ideal with planned smoking cessation in the near future.  PWP reviewed. - Given electronic prescription for 30 tablets of Adderall XR 3 mg daily and 30 tablets of regular Adderall 30 mg daily with 2 refills - RTC 3 months for controlled substance refill  Tobacco use disorder Chronic.  Motivated to begin cessation along with wife. - Set goal to quit with Chantix use beginning 12/18/2018 - RTC 1 month  No orders of the defined types were placed in this encounter.  Meds ordered this encounter  Medications  . amphetamine-dextroamphetamine (ADDERALL) 30 MG tablet    Sig: Take 1 tablet by mouth daily before breakfast.    Dispense:  30 tablet    Refill:  0    DO NOT FILL BEFORE 11/29/2018.  Marland Kitchen. amphetamine-dextroamphetamine (ADDERALL) 30 MG tablet    Sig: Take 1 tablet by mouth daily before breakfast.    Dispense:  30 tablet    Refill:  0    DO NOT FILL BEFORE 12/30/2018.  Marland Kitchen. amphetamine-dextroamphetamine (ADDERALL) 30 MG tablet    Sig: Take 1 tablet by mouth daily before breakfast.  Dispense:  30 tablet    Refill:  0    DO NOT FILL BEFORE 01/30/2019.  Marland Kitchen amphetamine-dextroamphetamine (ADDERALL XR) 30 MG 24 hr capsule    Sig: Take 1 capsule (30 mg total) by mouth daily.    Dispense:  30 capsule    Refill:  0    DO NOT FILL BEFORE 01/30/2019.  Marland Kitchen amphetamine-dextroamphetamine (ADDERALL XR) 30 MG 24 hr capsule    Sig: Take 1 capsule (30 mg total) by mouth daily.    Dispense:  30 capsule    Refill:  0    DO NOT FILL BEFORE 12/30/2018.  Marland Kitchen amphetamine-dextroamphetamine (ADDERALL XR) 30 MG 24 hr capsule    Sig:  Take 1 capsule (30 mg total) by mouth daily.    Dispense:  30 capsule    Refill:  0    DO NOT FILL BEFORE 11/29/2018.    Durward Parcel, DO [facility], PGY-3 11/25/2018, 5:27 PM

## 2019-02-10 MED FILL — ADDERALL XR 30 MG CAP SA: 30 | 30 days supply | Qty: 30 | Fill #0

## 2019-02-10 MED FILL — AMPHETAMINE-DEXTROAMPHETAMI: 30 | 30 days supply | Qty: 30 | Fill #0

## 2019-03-13 MED FILL — ADDERALL XR 30 MG CAP SA: 30 | 30 days supply | Qty: 30 | Fill #0

## 2019-03-13 MED FILL — AMPHETAMINE-DEXTROAMPHETAMI: 30 | 30 days supply | Qty: 30 | Fill #0

## 2019-04-20 DIAGNOSIS — F1120 Opioid dependence, uncomplicated: Secondary | ICD-10-CM | POA: Diagnosis not present

## 2019-04-27 DIAGNOSIS — F1120 Opioid dependence, uncomplicated: Secondary | ICD-10-CM | POA: Diagnosis not present

## 2019-05-04 DIAGNOSIS — F1120 Opioid dependence, uncomplicated: Secondary | ICD-10-CM | POA: Diagnosis not present

## 2019-05-11 DIAGNOSIS — F1120 Opioid dependence, uncomplicated: Secondary | ICD-10-CM | POA: Diagnosis not present

## 2019-05-19 ENCOUNTER — Other Ambulatory Visit: Payer: Self-pay | Admitting: Family Medicine

## 2019-05-19 DIAGNOSIS — F900 Attention-deficit hyperactivity disorder, predominantly inattentive type: Secondary | ICD-10-CM

## 2019-05-19 NOTE — Telephone Encounter (Signed)
Needs appt for f/u. Please advise.  Durward Parcel, DO [facility], PGY-3

## 2019-05-20 MED FILL — AMPHETAMINE-DEXTROAMPHETAMI: 30 | 30 days supply | Qty: 30 | Fill #0

## 2019-05-20 MED FILL — ADDERALL XR 30 MG CAP SA: 30 | 30 days supply | Qty: 30 | Fill #0

## 2019-05-25 DIAGNOSIS — F1120 Opioid dependence, uncomplicated: Secondary | ICD-10-CM | POA: Diagnosis not present

## 2019-06-01 DIAGNOSIS — F1120 Opioid dependence, uncomplicated: Secondary | ICD-10-CM | POA: Diagnosis not present

## 2019-06-08 DIAGNOSIS — F1120 Opioid dependence, uncomplicated: Secondary | ICD-10-CM | POA: Diagnosis not present

## 2019-06-22 DIAGNOSIS — F1120 Opioid dependence, uncomplicated: Secondary | ICD-10-CM | POA: Diagnosis not present

## 2019-06-29 DIAGNOSIS — F1120 Opioid dependence, uncomplicated: Secondary | ICD-10-CM | POA: Diagnosis not present

## 2019-07-06 DIAGNOSIS — F1120 Opioid dependence, uncomplicated: Secondary | ICD-10-CM | POA: Diagnosis not present

## 2019-07-13 DIAGNOSIS — F1120 Opioid dependence, uncomplicated: Secondary | ICD-10-CM | POA: Diagnosis not present

## 2019-07-20 DIAGNOSIS — F1120 Opioid dependence, uncomplicated: Secondary | ICD-10-CM | POA: Diagnosis not present

## 2019-07-27 DIAGNOSIS — F1120 Opioid dependence, uncomplicated: Secondary | ICD-10-CM | POA: Diagnosis not present

## 2019-08-01 ENCOUNTER — Other Ambulatory Visit: Payer: Self-pay

## 2019-08-01 DIAGNOSIS — F900 Attention-deficit hyperactivity disorder, predominantly inattentive type: Secondary | ICD-10-CM

## 2019-08-03 DIAGNOSIS — F1120 Opioid dependence, uncomplicated: Secondary | ICD-10-CM | POA: Diagnosis not present

## 2019-08-04 NOTE — Telephone Encounter (Signed)
Patient not seen since 2019. Will need appt before refill.

## 2019-08-05 NOTE — Telephone Encounter (Signed)
Informed via voicemail patient that an appointment is necessary in order for him to get his RX filled.  Ozella Almond, [place]

## 2019-08-10 DIAGNOSIS — F1120 Opioid dependence, uncomplicated: Secondary | ICD-10-CM | POA: Diagnosis not present

## 2019-08-11 ENCOUNTER — Other Ambulatory Visit: Payer: Self-pay | Admitting: Family Medicine

## 2019-08-11 ENCOUNTER — Encounter: Payer: Self-pay | Admitting: Family Medicine

## 2019-08-11 DIAGNOSIS — F900 Attention-deficit hyperactivity disorder, predominantly inattentive type: Secondary | ICD-10-CM

## 2019-08-11 MED ORDER — AMPHETAMINE-DEXTROAMPHET ER 30 MG PO CP24: ORAL_CAPSULE | ORAL | 0 refills | Status: DC

## 2019-08-11 MED ORDER — AMPHETAMINE-DEXTROAMPHETAMINE 30 MG PO TABS: 30.0000 mg | ORAL_TABLET | Freq: Every day | ORAL | 0 refills | Status: DC

## 2019-08-11 MED ORDER — AMPHETAMINE-DEXTROAMPHET ER 30 MG PO CP24: 30.0000 mg | ORAL_CAPSULE | Freq: Every day | ORAL | 0 refills | Status: DC

## 2019-08-11 NOTE — Progress Notes (Signed)
Called patient and he says that he will come in to pick RX up if necessary.  Patient states that he has always gotten it electronically with Dr. Yisroel Ramming.  Explained tor patient the Yisroel Ramming is no longer here.  Danny Edwards, [place]

## 2019-08-12 ENCOUNTER — Other Ambulatory Visit: Payer: Self-pay

## 2019-08-12 ENCOUNTER — Encounter: Payer: Self-pay | Admitting: Family Medicine

## 2019-08-12 ENCOUNTER — Ambulatory Visit (INDEPENDENT_AMBULATORY_CARE_PROVIDER_SITE_OTHER): Payer: 59 | Admitting: Family Medicine

## 2019-08-12 VITALS — BP 124/62 | HR 76 | Wt 234.4 lb

## 2019-08-12 DIAGNOSIS — Z5181 Encounter for therapeutic drug level monitoring: Secondary | ICD-10-CM

## 2019-08-12 DIAGNOSIS — F17200 Nicotine dependence, unspecified, uncomplicated: Secondary | ICD-10-CM

## 2019-08-12 DIAGNOSIS — F900 Attention-deficit hyperactivity disorder, predominantly inattentive type: Secondary | ICD-10-CM

## 2019-08-12 MED ORDER — AMPHETAMINE-DEXTROAMPHETAMINE 30 MG PO TABS: 30.0000 mg | ORAL_TABLET | Freq: Every day | ORAL | 0 refills | Status: DC

## 2019-08-12 MED ORDER — AMPHETAMINE-DEXTROAMPHET ER 30 MG PO CP24: 30.0000 mg | ORAL_CAPSULE | ORAL | 0 refills | Status: DC

## 2019-08-12 MED ORDER — AMPHETAMINE-DEXTROAMPHET ER 30 MG PO CP24: 30.0000 mg | ORAL_CAPSULE | Freq: Every day | ORAL | 0 refills | Status: DC

## 2019-08-12 MED FILL — ADDERALL XR 30 MG CAP SA: 30 | 30 days supply | Qty: 30 | Fill #0

## 2019-08-12 MED FILL — DEXTROAMPH TB 30MG NSTR 100: 30 | 30 days supply | Qty: 30 | Fill #0

## 2019-08-12 NOTE — Progress Notes (Signed)
Subjective:   Patient ID: Danny Edwards    DOB: March 03, 1986, 33 y.o. male   MRN: [id number]  Danny Edwards is a 33 y.o. male with a history of ADHD, anxiety, and tobacco use disorder here for ADHD follow up.  ADHD: Patient with longstanding ADHD well controlled with Adderall ER 30 mg qAM and 30 mg of regular in the afternoon.  Patient here today for refill request. Patient notes he is doing well on the current regimen. He feels it helps him concentrate better while at work and has difficulty staying focused while off the medication. Denies any problems with sleep or appetite. Weight stable. Patient notes the drug tests are not covered by [employer] insurance and the tests cost him $250 each.   Tobacco use disorder: Patient is a chronic smoker. Smokes little less than 1 PPD x 13 years. Smokes with his wife. He desires to quit but stress/anxiety keep stopping him. He has rx for Chantix but is reluctant to start due to family members having adverse effects with the medicine. His wife plans on starting the Chantix so he is confident that they will give it a try this time. He denies cough, sputum production, wheeze, shortness of breath or chest pain.   Review of Systems:  Per HPI.   PMFSH, medications and smoking status reviewed.  Objective:   BP 124/62   Pulse 76   Wt 234 lb 6.4 oz (106.3 kg)   SpO2 99%   BMI 34.61 kg/m  Vitals and nursing note reviewed.  General: well nourished, well developed, in no acute distress with non-toxic appearance, sitting comfortably in exam chair HEENT: normocephalic, atraumatic, moist mucous membranes, oropharynx clear without erythema or exudate CV: regular rate and rhythm without murmurs, rubs, or gallops, no lower extremity edema Lungs: clear to auscultation bilaterally with normal work of breathing Skin: warm, dry Extremities: warm and well perfused MSK:  gait normal Neuro: Alert and oriented, speech normal  Assessment & Plan:   ADHD, predominantly  inattentive type Chronic and well controlled with Adderall without any side effects. PDMP aware reviewed. No red flags. Weight stable. - Electronic rx for 30 tabs of Adderall XR 30mg  QD and 30 tabs of regular Adderall 30mg  QD with 2 refills - [facility] - given out of pocket cost for [facility], patient will have monthly drug tests [facility] - RTC in 3 months (Tele or in person) for controlled substance refill  Tobacco use disorder Endorses desire to quit. Plans to start Chantix with wife (she has picked up her rx). Discussed adverse effects and ways to alleviate these effects. If patient dislikes Chantix, can consider nicotine replacement.   Meds ordered this encounter  Medications  . amphetamine-dextroamphetamine (ADDERALL) 30 MG tablet    Sig: Take 1 tablet by mouth daily before breakfast.    Dispense:  30 tablet    Refill:  0    DO NOT FILL UNTIL 10/11/2019  . amphetamine-dextroamphetamine (ADDERALL) 30 MG tablet    Sig: Take 1 tablet by mouth daily before breakfast.    Dispense:  30 tablet    Refill:  0    DO NOT FILL UNTIL 09/11/2019  . amphetamine-dextroamphetamine (ADDERALL XR) 30 MG 24 hr capsule    Sig: Take 1 capsule (30 mg total) by mouth daily.    Dispense:  30 capsule    Refill:  0    DO NOT  FILL UNTIL 09/11/2019  . amphetamine-dextroamphetamine (ADDERALL XR) 30 MG 24 hr capsule    Sig: Take 1 capsule (30 mg total) by mouth every morning.    Dispense:  30 capsule    Refill:  0    DO NOT FILL UNTIL 10/11/2019  . amphetamine-dextroamphetamine (ADDERALL) 30 MG tablet    Sig: Take 1 tablet by mouth daily.    Dispense:  30 tablet    Refill:  0  . amphetamine-dextroamphetamine (ADDERALL XR) 30 MG 24 hr capsule    Sig: Take 1 capsule (30 mg total) by mouth every morning.    Dispense:  30 capsule    Refill:  0    Mina Marble, DO PGY-2, [facility] 08/12/2019 10:44  AM

## 2019-08-12 NOTE — Assessment & Plan Note (Addendum)
Chronic and well controlled with Adderall without any side effects. PDMP aware reviewed. No red flags. Weight stable. - Electronic rx for 30 tabs of Adderall XR 30mg  QD and 30 tabs of regular Adderall 30mg  QD with 2 refills - Patient has monthly drug tests at [place] - given out of pocket cost for drug test at [facility], patient will have monthly drug tests forwarded to [facility] for record - RTC in 3 months (Tele or in person) for controlled substance refill

## 2019-08-12 NOTE — Patient Instructions (Signed)
Thank you so much for coming in to see me today! I have sent in three months of your prescriptions to your pharmacy. Please follow up in 3 months (either via telemedicine or in person) for your follow up.   I am so happy you have decided to try Chantix and quit smoking. Please let me know if you have any questions.  Take care, Dr. Tarry Kos

## 2019-08-12 NOTE — Assessment & Plan Note (Addendum)
Endorses desire to quit. Plans to start Chantix with wife (she has picked up her rx). Discussed adverse effects and ways to alleviate these effects. If patient dislikes Chantix, can consider nicotine replacement.

## 2019-08-24 DIAGNOSIS — F1120 Opioid dependence, uncomplicated: Secondary | ICD-10-CM | POA: Diagnosis not present

## 2019-08-26 ENCOUNTER — Ambulatory Visit: Payer: 59 | Admitting: Family Medicine

## 2019-08-31 DIAGNOSIS — F1120 Opioid dependence, uncomplicated: Secondary | ICD-10-CM | POA: Diagnosis not present

## 2019-09-07 DIAGNOSIS — F1120 Opioid dependence, uncomplicated: Secondary | ICD-10-CM | POA: Diagnosis not present

## 2019-09-14 DIAGNOSIS — F1120 Opioid dependence, uncomplicated: Secondary | ICD-10-CM | POA: Diagnosis not present

## 2019-09-21 DIAGNOSIS — F1120 Opioid dependence, uncomplicated: Secondary | ICD-10-CM | POA: Diagnosis not present

## 2019-09-28 DIAGNOSIS — F1120 Opioid dependence, uncomplicated: Secondary | ICD-10-CM | POA: Diagnosis not present

## 2019-10-02 MED FILL — ADDERALL XR 30 MG CAP SA: 30 | 30 days supply | Qty: 30 | Fill #0

## 2019-10-02 MED FILL — AMPHETAMINE-DEXTROAMPHETAMI: 30 | 30 days supply | Qty: 30 | Fill #0

## 2019-10-05 DIAGNOSIS — F1120 Opioid dependence, uncomplicated: Secondary | ICD-10-CM | POA: Diagnosis not present

## 2019-10-12 DIAGNOSIS — F1120 Opioid dependence, uncomplicated: Secondary | ICD-10-CM | POA: Diagnosis not present

## 2019-10-19 DIAGNOSIS — F1120 Opioid dependence, uncomplicated: Secondary | ICD-10-CM | POA: Diagnosis not present

## 2019-10-26 DIAGNOSIS — F1120 Opioid dependence, uncomplicated: Secondary | ICD-10-CM | POA: Diagnosis not present

## 2019-11-02 DIAGNOSIS — F1120 Opioid dependence, uncomplicated: Secondary | ICD-10-CM | POA: Diagnosis not present

## 2019-11-09 DIAGNOSIS — F1120 Opioid dependence, uncomplicated: Secondary | ICD-10-CM | POA: Diagnosis not present

## 2019-11-20 MED FILL — ADDERALL XR 30 MG CAP SA: 30 | 30 days supply | Qty: 30 | Fill #0

## 2019-11-20 MED FILL — AMPHETAMINE-DEXTROAMPHETAMI: 30 | 30 days supply | Qty: 30 | Fill #0

## 2019-11-23 DIAGNOSIS — F1120 Opioid dependence, uncomplicated: Secondary | ICD-10-CM | POA: Diagnosis not present

## 2019-11-30 DIAGNOSIS — F1120 Opioid dependence, uncomplicated: Secondary | ICD-10-CM | POA: Diagnosis not present

## 2019-12-04 DIAGNOSIS — F1120 Opioid dependence, uncomplicated: Secondary | ICD-10-CM | POA: Diagnosis not present

## 2019-12-05 ENCOUNTER — Other Ambulatory Visit: Payer: Self-pay

## 2019-12-05 ENCOUNTER — Emergency Department
Admission: EM | Admit: 2019-12-05 | Discharge: 2019-12-05 | Discharge status: Absent without leave | Payer: 59 | Source: Home / Self Care

## 2019-12-05 DIAGNOSIS — H6692 Otitis media, unspecified, left ear: Secondary | ICD-10-CM | POA: Diagnosis not present

## 2019-12-05 MED FILL — AMOXICILLIN 875 MG TABS: 875 | 7 days supply | Qty: 14 | Fill #0

## 2019-12-07 DIAGNOSIS — F1120 Opioid dependence, uncomplicated: Secondary | ICD-10-CM | POA: Diagnosis not present

## 2019-12-14 DIAGNOSIS — F1120 Opioid dependence, uncomplicated: Secondary | ICD-10-CM | POA: Diagnosis not present

## 2019-12-21 DIAGNOSIS — F1120 Opioid dependence, uncomplicated: Secondary | ICD-10-CM | POA: Diagnosis not present

## 2019-12-28 DIAGNOSIS — F1120 Opioid dependence, uncomplicated: Secondary | ICD-10-CM | POA: Diagnosis not present

## 2020-01-01 DIAGNOSIS — F1120 Opioid dependence, uncomplicated: Secondary | ICD-10-CM | POA: Diagnosis not present

## 2020-01-04 DIAGNOSIS — F1120 Opioid dependence, uncomplicated: Secondary | ICD-10-CM | POA: Diagnosis not present

## 2020-01-11 DIAGNOSIS — F1120 Opioid dependence, uncomplicated: Secondary | ICD-10-CM | POA: Diagnosis not present

## 2020-01-16 ENCOUNTER — Other Ambulatory Visit: Payer: Self-pay | Admitting: Family Medicine

## 2020-01-16 DIAGNOSIS — F900 Attention-deficit hyperactivity disorder, predominantly inattentive type: Secondary | ICD-10-CM

## 2020-01-16 MED FILL — AMPHETAMINE-DEXTROAMPHETAMI: 30 | 30 days supply | Qty: 30 | Fill #0

## 2020-01-16 MED FILL — ADDERALL XR 30 MG CAP SA: 30 | 30 days supply | Qty: 30 | Fill #0

## 2020-01-18 DIAGNOSIS — F1120 Opioid dependence, uncomplicated: Secondary | ICD-10-CM | POA: Diagnosis not present

## 2020-01-25 DIAGNOSIS — F1120 Opioid dependence, uncomplicated: Secondary | ICD-10-CM | POA: Diagnosis not present

## 2020-02-01 DIAGNOSIS — F1120 Opioid dependence, uncomplicated: Secondary | ICD-10-CM | POA: Diagnosis not present

## 2020-02-08 DIAGNOSIS — F1120 Opioid dependence, uncomplicated: Secondary | ICD-10-CM | POA: Diagnosis not present

## 2020-02-15 DIAGNOSIS — F1120 Opioid dependence, uncomplicated: Secondary | ICD-10-CM | POA: Diagnosis not present

## 2020-02-22 DIAGNOSIS — F1120 Opioid dependence, uncomplicated: Secondary | ICD-10-CM | POA: Diagnosis not present

## 2020-02-26 ENCOUNTER — Other Ambulatory Visit: Payer: Self-pay | Admitting: Family Medicine

## 2020-02-26 DIAGNOSIS — F1120 Opioid dependence, uncomplicated: Secondary | ICD-10-CM | POA: Diagnosis not present

## 2020-02-26 DIAGNOSIS — F900 Attention-deficit hyperactivity disorder, predominantly inattentive type: Secondary | ICD-10-CM

## 2020-02-27 MED FILL — AMPHETAMINE-DEXTROAMPHETAMI: 30 | 30 days supply | Qty: 30 | Fill #0

## 2020-02-27 MED FILL — ADDERALL XR 30 MG CAP SA: 30 | 30 days supply | Qty: 30 | Fill #0

## 2020-02-29 DIAGNOSIS — F1120 Opioid dependence, uncomplicated: Secondary | ICD-10-CM | POA: Diagnosis not present

## 2020-03-07 DIAGNOSIS — F1120 Opioid dependence, uncomplicated: Secondary | ICD-10-CM | POA: Diagnosis not present

## 2020-03-14 DIAGNOSIS — F1120 Opioid dependence, uncomplicated: Secondary | ICD-10-CM | POA: Diagnosis not present

## 2020-03-21 DIAGNOSIS — F1120 Opioid dependence, uncomplicated: Secondary | ICD-10-CM | POA: Diagnosis not present

## 2020-03-25 DIAGNOSIS — F1120 Opioid dependence, uncomplicated: Secondary | ICD-10-CM | POA: Diagnosis not present

## 2020-03-28 DIAGNOSIS — F1120 Opioid dependence, uncomplicated: Secondary | ICD-10-CM | POA: Diagnosis not present

## 2020-04-04 DIAGNOSIS — F1120 Opioid dependence, uncomplicated: Secondary | ICD-10-CM | POA: Diagnosis not present

## 2020-04-09 ENCOUNTER — Other Ambulatory Visit: Payer: Self-pay | Admitting: Family Medicine

## 2020-04-09 ENCOUNTER — Encounter: Payer: Self-pay | Admitting: Family Medicine

## 2020-04-09 DIAGNOSIS — F900 Attention-deficit hyperactivity disorder, predominantly inattentive type: Secondary | ICD-10-CM

## 2020-04-11 DIAGNOSIS — F1120 Opioid dependence, uncomplicated: Secondary | ICD-10-CM | POA: Diagnosis not present

## 2020-04-12 ENCOUNTER — Other Ambulatory Visit: Payer: Self-pay | Admitting: Family Medicine

## 2020-04-12 DIAGNOSIS — F900 Attention-deficit hyperactivity disorder, predominantly inattentive type: Secondary | ICD-10-CM

## 2020-04-13 MED ORDER — AMPHETAMINE-DEXTROAMPHET ER 30 MG PO CP24: 30.0000 mg | ORAL_CAPSULE | Freq: Every morning | ORAL | 0 refills | Status: DC

## 2020-04-13 MED ORDER — AMPHETAMINE-DEXTROAMPHETAMINE 30 MG PO TABS: 30.0000 mg | ORAL_TABLET | Freq: Every day | ORAL | 0 refills | Status: DC

## 2020-04-13 MED FILL — ADDERALL XR 30 MG CAP SA: 30 | 30 days supply | Qty: 30 | Fill #0

## 2020-04-13 MED FILL — AMPHETAMINE-DEXTROAMPHETAMI: 30 | 30 days supply | Qty: 30 | Fill #0

## 2020-04-18 DIAGNOSIS — F1120 Opioid dependence, uncomplicated: Secondary | ICD-10-CM | POA: Diagnosis not present

## 2020-04-22 DIAGNOSIS — F1120 Opioid dependence, uncomplicated: Secondary | ICD-10-CM | POA: Diagnosis not present

## 2020-04-25 DIAGNOSIS — F1120 Opioid dependence, uncomplicated: Secondary | ICD-10-CM | POA: Diagnosis not present

## 2020-05-02 DIAGNOSIS — F1120 Opioid dependence, uncomplicated: Secondary | ICD-10-CM | POA: Diagnosis not present

## 2020-05-09 DIAGNOSIS — F1120 Opioid dependence, uncomplicated: Secondary | ICD-10-CM | POA: Diagnosis not present

## 2020-05-16 DIAGNOSIS — F1120 Opioid dependence, uncomplicated: Secondary | ICD-10-CM | POA: Diagnosis not present

## 2020-05-20 DIAGNOSIS — F1120 Opioid dependence, uncomplicated: Secondary | ICD-10-CM | POA: Diagnosis not present

## 2020-05-23 DIAGNOSIS — F1120 Opioid dependence, uncomplicated: Secondary | ICD-10-CM | POA: Diagnosis not present

## 2020-05-28 ENCOUNTER — Emergency Department
Admission: EM | Admit: 2020-05-28 | Discharge: 2020-05-28 | Disposition: A | Discharge status: Home / Self Care | Payer: 59 | Source: Home / Self Care | Attending: Family Medicine | Admitting: Family Medicine

## 2020-05-28 ENCOUNTER — Emergency Department (INDEPENDENT_AMBULATORY_CARE_PROVIDER_SITE_OTHER): Payer: 59

## 2020-05-28 ENCOUNTER — Other Ambulatory Visit: Payer: Self-pay

## 2020-05-28 ENCOUNTER — Encounter: Payer: Self-pay | Admitting: Emergency Medicine

## 2020-05-28 DIAGNOSIS — S4992XA Unspecified injury of left shoulder and upper arm, initial encounter: Secondary | ICD-10-CM

## 2020-05-28 DIAGNOSIS — M25512 Pain in left shoulder: Secondary | ICD-10-CM | POA: Diagnosis not present

## 2020-05-28 DIAGNOSIS — S46912A Strain of unspecified muscle, fascia and tendon at shoulder and upper arm level, left arm, initial encounter: Secondary | ICD-10-CM | POA: Diagnosis not present

## 2020-05-28 HISTORY — DX: Attention-deficit hyperactivity disorder, unspecified type: F90.9

## 2020-05-28 NOTE — ED Provider Notes (Signed)
[name]    CSN: [id number] Arrival date & time: 05/28/20  0943      History   Chief Complaint Chief Complaint  Patient presents with  . Shoulder Injury    left    HPI Danny Edwards is a 34 y.o. male.   Patient reports that about 2 weeks ago he forcefully pushed himself up and out of a chair.  Afterwards he felt pain in his left shoulder that has persisted.  The history is provided by the patient.  Shoulder Injury This is a new problem. The current episode started more than 1 week ago. The problem occurs constantly. The problem has not changed since onset.Pertinent negatives include no chest pain. Exacerbated by: shoulder movement and palpation. Treatments tried: Ibuprofen. The treatment provided mild relief.    Past Medical History:  Diagnosis Date  . ADHD     Patient Active Problem List   Diagnosis Date Noted  . ADHD, predominantly inattentive type 01/25/2016  . Tobacco use disorder 01/25/2016    Past Surgical History:  Procedure Laterality Date  . KNEE SURGERY Left 2008   ACL tear       Home Medications    Prior to Admission medications   Medication Sig Start Date End Date Taking? Authorizing Provider  amphetamine-dextroamphetamine (ADDERALL XR) 30 MG 24 hr capsule Take 1 capsule (30 mg total) by mouth every morning. 04/13/20  Yes Mullis, Kiersten P, DO  methadone (DOLOPHINE) 10 MG tablet Take 74 mg by mouth daily.   Yes [provider]  varenicline (CHANTIX CONTINUING MONTH PAK) 1 MG tablet Take 1 tablet (1 mg total) by mouth 2 (two) times daily. 02/12/18   Wendee Beavers, DO  varenicline (CHANTIX STARTING MONTH PAK) 0.5 MG X 11 & 1 MG X 42 tablet Take one 0.5 mg tablet by mouth once daily for 3 days, then increase to one 0.5 mg tablet twice daily for 4 days, then increase to one 1 mg tablet twice daily. 02/12/18   Wendee Beavers, DO    Family History Family History  Problem Relation Age of Onset  . Thyroid disease Mother   .  Healthy Father   . Healthy Sister   . Healthy Brother   . Healthy Brother     Social History Social History   Tobacco Use  . Smoking status: Current Every Day Smoker    Packs/day: 1.00    Years: 9.00    Pack years: 9.00    Types: Cigarettes  . Smokeless tobacco: Former Neurosurgeon    Types: Engineer, drilling  . Vaping Use: Never used  Substance Use Topics  . Alcohol use: Yes    Alcohol/week: 1.0 standard drink    Types: 1 Cans of beer per week  . Drug use: No     Allergies   Patient has no known allergies.   Review of Systems Review of Systems  Constitutional: Negative.   HENT: Negative.   Respiratory: Negative.   Cardiovascular: Negative for chest pain.  Gastrointestinal: Negative.   Genitourinary: Negative.   Musculoskeletal: Positive for neck pain. Negative for joint swelling and neck stiffness.  Skin: Negative.      Physical Exam Triage Vital Signs ED Triage Vitals  Enc Vitals Group     BP 05/28/20 0958 110/76     Pulse Rate 05/28/20 0958 70     Resp 05/28/20 0958 15     Temp 05/28/20 0958 98.2 F (36.8 C)     Temp  Source 05/28/20 0958 Oral     SpO2 05/28/20 0958 99 %     Weight 05/28/20 1000 223 lb (101.2 kg)     Height 05/28/20 1000 5\' 9"  (1.753 m)     Head Circumference --      Peak Flow --      Pain Score 05/28/20 0959 4     Pain Loc --      Pain Edu? --      Excl. in [place]? --    No data found.  Updated Vital Signs BP 110/76 (BP Location: Right Arm)   Pulse 70   Temp 98.2 F (36.8 C) (Oral)   Resp 15   Ht 5\' 9"  (1.753 m)   Wt 101.2 kg   SpO2 99%   BMI 32.93 kg/m   Visual Acuity Right Eye Distance:   Left Eye Distance:   Bilateral Distance:    Right Eye Near:   Left Eye Near:    Bilateral Near:     Physical Exam Vitals and nursing note reviewed.  Constitutional:      General: He is not in acute distress. HENT:     Head: Normocephalic.  Eyes:     Pupils: Pupils are equal, round, and reactive to light.  Cardiovascular:      Rate and Rhythm: Normal rate.     Heart sounds: Normal heart sounds.  Pulmonary:     Breath sounds: Normal breath sounds.  Musculoskeletal:        General: No swelling or deformity.     Left shoulder: Tenderness and bony tenderness present. No swelling, deformity or crepitus. Normal range of motion. Normal strength. Normal pulse.       Arms:     Cervical back: Normal range of motion. No tenderness.     Comments: Left shoulder has tenderness to palpation over the distal clavicle and AC joint.  Shoulder has full range of motion.  Negative Apley's and empty can tests.  Normal internal/external rotation strength.  Distal neurovascular function is intact.   Skin:    General: Skin is warm and dry.     Findings: No rash.  Neurological:     Mental Status: He is alert.      UC Treatments / Results  Labs (all labs ordered are listed, but only abnormal results are displayed) Labs Reviewed - No data to display  EKG   Radiology DG Shoulder Left  Result Date: 05/28/2020 CLINICAL DATA:  Left shoulder injury 05/17/2020 with persistent pain. EXAM: LEFT SHOULDER - 2+ VIEW COMPARISON:  None. FINDINGS: There is no evidence of fracture or dislocation. There is no evidence of arthropathy or other focal bone abnormality. Soft tissues are unremarkable. IMPRESSION: Negative. Electronically Signed   By: Marin Olp M.D.   On: 05/28/2020 10:48    Procedures Procedures (including critical care time)  Medications Ordered in UC Medications - No data to display  Initial Impression / Assessment and Plan / UC Course  I have reviewed the triage vital signs and the nursing notes.  Pertinent labs & imaging results that were available during my care of the patient were reviewed by me and considered in my medical decision making (see chart for details).    Suspect strain; no evidence rotator cuff injury. Given strain treatment instructions with range of motion and stretching exercises.  Followup with Dr.  Aundria Mems ([facility]) if not improving about two weeks.    Final Clinical Impressions(s) / UC Diagnoses   Final diagnoses:  Strain of AC joint, left, initial encounter     Discharge Instructions     Apply ice pack for 20 to 30 minutes, 3 to 4 times daily  Continue until pain and swelling decrease.  Begin range of motion and stretching exercises as tolerated. May take Ibuprofen 200mg , 4 tabs every 8 hours with food.     ED Prescriptions    None        , MD 05/28/20 1118

## 2020-05-28 NOTE — ED Triage Notes (Signed)
Hurt his left shoulder on memorial day pushing out of chair Sharp pain intermittent since then Ice yesterday  No OTC meds  Denies numbness or tingling Covid Vaccine Feb 2021 [employer])

## 2020-05-28 NOTE — Discharge Instructions (Addendum)
Apply ice pack for 20 to 30 minutes, 3 to 4 times daily  Continue until pain and swelling decrease.  Begin range of motion and stretching exercises as tolerated. May take Ibuprofen 200mg , 4 tabs every 8 hours with food.

## 2020-05-30 DIAGNOSIS — F1120 Opioid dependence, uncomplicated: Secondary | ICD-10-CM | POA: Diagnosis not present

## 2020-06-06 DIAGNOSIS — F1120 Opioid dependence, uncomplicated: Secondary | ICD-10-CM | POA: Diagnosis not present

## 2020-06-08 ENCOUNTER — Encounter: Payer: Self-pay | Admitting: Family Medicine

## 2020-06-08 ENCOUNTER — Ambulatory Visit (INDEPENDENT_AMBULATORY_CARE_PROVIDER_SITE_OTHER): Payer: 59 | Admitting: Family Medicine

## 2020-06-08 ENCOUNTER — Other Ambulatory Visit: Payer: Self-pay | Admitting: Family Medicine

## 2020-06-08 ENCOUNTER — Other Ambulatory Visit: Payer: Self-pay

## 2020-06-08 VITALS — BP 108/60 | HR 83 | Ht 69.0 in | Wt 218.1 lb

## 2020-06-08 DIAGNOSIS — Z0000 Encounter for general adult medical examination without abnormal findings: Secondary | ICD-10-CM

## 2020-06-08 DIAGNOSIS — E782 Mixed hyperlipidemia: Secondary | ICD-10-CM

## 2020-06-08 DIAGNOSIS — F900 Attention-deficit hyperactivity disorder, predominantly inattentive type: Secondary | ICD-10-CM | POA: Diagnosis not present

## 2020-06-08 DIAGNOSIS — F17200 Nicotine dependence, unspecified, uncomplicated: Secondary | ICD-10-CM

## 2020-06-08 DIAGNOSIS — R1084 Generalized abdominal pain: Secondary | ICD-10-CM

## 2020-06-08 DIAGNOSIS — Z13220 Encounter for screening for lipoid disorders: Secondary | ICD-10-CM | POA: Diagnosis not present

## 2020-06-08 DIAGNOSIS — R3581 Nocturnal polyuria: Secondary | ICD-10-CM

## 2020-06-08 DIAGNOSIS — R350 Frequency of micturition: Secondary | ICD-10-CM | POA: Diagnosis not present

## 2020-06-08 DIAGNOSIS — R351 Nocturia: Secondary | ICD-10-CM

## 2020-06-08 DIAGNOSIS — Z1159 Encounter for screening for other viral diseases: Secondary | ICD-10-CM

## 2020-06-08 LAB — POCT URINALYSIS DIP (MANUAL ENTRY)
Bilirubin, UA: NEGATIVE
Blood, UA: NEGATIVE
Glucose, UA: NEGATIVE mg/dL
Ketones, POC UA: NEGATIVE mg/dL
Leukocytes, UA: NEGATIVE
Nitrite, UA: NEGATIVE
Protein Ur, POC: NEGATIVE mg/dL
Spec Grav, UA: 1.025 (ref 1.010–1.025)
Urobilinogen, UA: 0.2 E.U./dL
pH, UA: 7 (ref 5.0–8.0)

## 2020-06-08 MED ORDER — AMPHETAMINE-DEXTROAMPHETAMINE 30 MG PO TABS: 30.0000 mg | ORAL_TABLET | Freq: Every day | ORAL | 0 refills | Status: DC

## 2020-06-08 MED ORDER — AMPHETAMINE-DEXTROAMPHET ER 30 MG PO CP24: 30.0000 mg | ORAL_CAPSULE | Freq: Every morning | ORAL | 0 refills | Status: DC

## 2020-06-08 MED FILL — ADDERALL XR 30 MG CAP SA: 30 | 30 days supply | Qty: 30 | Fill #0

## 2020-06-08 MED FILL — AMPHETAMINE-DEXTROAMPHETAMI: 30 | 30 days supply | Qty: 30 | Fill #0

## 2020-06-08 NOTE — Progress Notes (Addendum)
SUBJECTIVE:   Chief compliant/HPI: annual examination  Danny Edwards is a 34 y.o. who presents today for an annual exam and med refill.  Acute Concerns: Endorses feeling of "stomach in knots" primarily when he is nervous. He notes that he does have to use the bathroom shortly after but it is not diarrhea. Denies nausea, vomiting. Denies hematochezia or melena. Symptoms resolve when he calms down. Occasionally gets fast heart rate and sweaty but mostly he paces. He notes that he has had a lot of things going on recently which he feels is contributing. Denies SI/HI. Denies relation to food.   Urinary Frequency: Endorses gong to the bathroom multiple times a night - 3-4 times per night. He notes it has improved some, he only went to the bath room once. He notes that cutting back on fluids prior to bed has improved. Does endorse difficulty starting his stream but this only occurs sometimes. Denies dysuria, discharge, hematuria. Denies personal or family history of prostate issues. Denies symptoms during the day.   ADHD: Currently on Adderall XR 30mg  QD in AM and Adderall IR 30mg  in PM. Endorses compliance. Denies difficulty with sleep or appetite. Denies weight loss. Patient receives monthly drug tests at [facility]. Patient to have forwarded to [facility].   Health Maintenance: Due for Hep C screening  Reviewed and updated history.  Review of systems negative other than stated above.   OBJECTIVE:   BP 108/60   Pulse 83   Ht 5\' 9"  (1.753 m)   Wt 218 lb 2 oz (98.9 kg)   SpO2 99%   BMI 32.21 kg/m   General: pleasant middle aged male, well nourished, well developed, in no acute distress with non-toxic appearance, sitting comfortably in exam chair CV: regular rate and rhythm without murmurs, rubs, or gallops, no lower extremity edema Lungs: clear to auscultation bilaterally with normal work of breathing Abdomen: soft, non-tender, non-distended, normoactive bowel  sounds Skin: warm, dry Extremities: warm and well perfused MSK:  gait normal Neuro: Alert and oriented, speech normal Prostate Exam: declined  GAD 7 : Generalized Anxiety Score 06/08/2020  Nervous, Anxious, on Edge 2  Control/stop worrying 3  Worry too much - different things 3  Trouble relaxing 1  Restless 0  Easily annoyed or irritable 3  Afraid - awful might happen 3  Total GAD 7 Score 15  Anxiety Difficulty Somewhat difficult    Depression screen [facility] 2/9 06/08/2020 06/08/2020 11/25/2018  Decreased Interest 3 0 0  Down, Depressed, Hopeless 0 0 0  PHQ - 2 Score 3 0 0  Altered sleeping 1 - -  Tired, decreased energy 1 - -  Change in appetite 0 - -  Feeling bad or failure about yourself  1 - -  Trouble concentrating 0 - -  Moving slowly or fidgety/restless 1 - -  Suicidal thoughts 0 - -  PHQ-9 Score 7 - -  Difficult doing work/chores Somewhat difficult - -   ASSESSMENT/PLAN:   ADHD, predominantly inattentive type Chronic, well controlled. Denies any adverse effects, problems with weight or sleep. [facility] Requested patient to have results faxed for records. PDMP reviewed with no red flags.  - Refill for Aderall XR 30mg  QD in AM #30, Adderall 30mg  IR QD in PM #30, no refills  Tobacco use disorder Smoks 1 pack/day x 14 years (14 pack year history). Does not qualify for lung cancer screen at this time. Patient was  counseled on the risks of tobacco use and cessation strongly encouraged.   Nocturnal polyuria Acute. Endorses frequent nocturnal urination with normal daytime urine frequency. UA negative for protein, infection, or glucose ruling out several causes. Appears euvolemic on exam thus less likely CHF.  Electrolytes normal and normal kidney function which is also reassuring. Although less likely contributing to his symptoms given inconsistency of symptoms, discussed prostate exam which patient declined. Given recent  improvement, feel monitoring of symptoms is reasonable option for now. Recommended patient limit water intake 2-3 hours prior to bed to help. Recommended if persists in 1 month patient RTC for further evaluation. Can consider OSA evaluation and/or 24-urine collection.   Generalized abdominal discomfort Appears anxiety induced abdominal pain in setting of tachycardia and diaphoresis. Concerning for possible panic disorder. GAD-7 score of 15 indicated severe anxiety. PHQ-9 score of 7. Abdominal exam unremarkable today. No infectious symptoms or relation to food. Considered IBS however given his symptoms appear to be more related to his anxiety will focus treatment on his anxiety and follow up if persists.  Follow up virtual appt scheduled to discuss anxiety on 06/15/20.  Mixed hyperlipidemia Lipid panel with Chol 208, HDL 44, Trig 89, and LDL 148 in setting of current everyday smoker. Recommend lifestyle modifications including weight loss, exercise, and diet focused on good fats and avoidance of bad/saturated fats. Can consider statin therapy if remains elevated.   Annual Examination  See AVS for age appropriate recommendations  PHQ score of 7, reviewed and discussed.  Blood pressure reviewed and at goal.    Considered the following items based upon USPSTF recommendations: HIV testing: not indicated. Completed. Monogamous relationship with wife. Hepatitis C: ordered Hepatitis B: ordered Syphilis if at high risk: {not indicated and declined GC/CTnot indicated and declined Lipid panel (nonfasting or fasting) discussed based upon AHA recommendations and ordered.  Consider repeat every 4-6 years.  Reviewed risk factors for latent tuberculosis and not indicated Immunizations; up to date  Follow up in 1 year or sooner if indicated.   [place]

## 2020-06-09 LAB — HEPATITIS C ANTIBODY (REFLEX): HCV Ab: <0.1 s/co ratio (ref 0.0–0.9)

## 2020-06-09 LAB — BASIC METABOLIC PANEL
BUN/Creatinine Ratio: 15 (ref 9–20)
BUN: 15 mg/dL (ref 6–20)
CO2: 22 mmol/L (ref 20–29)
Calcium: 9.2 mg/dL (ref 8.7–10.2)
Chloride: 104 mmol/L (ref 96–106)
Creatinine, Ser: 1.03 mg/dL (ref 0.76–1.27)
GFR calc Af Amer: 111 mL/min/{1.73_m2} (ref >59)
GFR calc non Af Amer: 96 mL/min/{1.73_m2} (ref >59)
Glucose: 88 mg/dL (ref 65–99)
Potassium: 4.5 mmol/L (ref 3.5–5.2)
Sodium: 140 mmol/L (ref 134–144)

## 2020-06-09 LAB — HCV COMMENT:

## 2020-06-09 LAB — LIPID PANEL
Chol/HDL Ratio: 4.7 ratio (ref 0.0–5.0)
Cholesterol, Total: 208 mg/dL — ABNORMAL HIGH (ref 100–199)
HDL: 44 mg/dL (ref >39)
LDL Chol Calc (NIH): 148 mg/dL — ABNORMAL HIGH (ref 0–99)
Triglycerides: 89 mg/dL (ref 0–149)
VLDL Cholesterol Cal: 16 mg/dL (ref 5–40)

## 2020-06-09 LAB — HEPATITIS B SURFACE ANTIGEN: Hepatitis B Surface Ag: NEGATIVE

## 2020-06-13 ENCOUNTER — Encounter: Payer: Self-pay | Admitting: Family Medicine

## 2020-06-13 DIAGNOSIS — R3581 Nocturnal polyuria: Secondary | ICD-10-CM | POA: Insufficient documentation

## 2020-06-13 DIAGNOSIS — E782 Mixed hyperlipidemia: Secondary | ICD-10-CM | POA: Insufficient documentation

## 2020-06-13 DIAGNOSIS — R1084 Generalized abdominal pain: Secondary | ICD-10-CM | POA: Insufficient documentation

## 2020-06-13 DIAGNOSIS — F1120 Opioid dependence, uncomplicated: Secondary | ICD-10-CM | POA: Diagnosis not present

## 2020-06-13 NOTE — Assessment & Plan Note (Signed)
Lipid panel with Chol 208, HDL 44, Trig 89, and LDL 148 in setting of current everyday smoker. Recommend lifestyle modifications including weight loss, exercise, and diet focused on good fats and avoidance of bad/saturated fats. Can consider statin therapy if remains elevated.

## 2020-06-13 NOTE — Assessment & Plan Note (Signed)
Acute. Endorses frequent nocturnal urination with normal daytime urine frequency. UA negative for protein, infection, or glucose ruling out several causes. Appears euvolemic on exam thus less likely CHF.  Electrolytes normal and normal kidney function which is also reassuring. Although less likely contributing to his symptoms given inconsistency of symptoms, discussed prostate exam which patient declined. Given recent improvement, feel monitoring of symptoms is reasonable option for now. Recommended patient limit water intake 2-3 hours prior to bed to help. Recommended if persists in 1 month patient RTC for further evaluation. Can consider OSA evaluation and/or 24-urine collection.

## 2020-06-13 NOTE — Assessment & Plan Note (Signed)
Smoks 1 pack/day x 14 years (14 pack year history). Does not qualify for lung cancer screen at this time. Patient was counseled on the risks of tobacco use and cessation strongly encouraged.

## 2020-06-13 NOTE — Assessment & Plan Note (Signed)
Chronic, well controlled. Denies any adverse effects, problems with weight or sleep. Patient [facility] drug [facility]. Requested patient to have results faxed for records. PDMP reviewed with no red flags.  - Refill for Aderall XR 30mg  QD in AM #30, Adderall 30mg  IR QD in PM #30, no refills

## 2020-06-13 NOTE — Assessment & Plan Note (Signed)
Appears anxiety induced abdominal pain in setting of tachycardia and diaphoresis. Concerning for possible panic disorder. GAD-7 score of 15 indicated severe anxiety. PHQ-9 score of 7. Abdominal exam unremarkable today. No infectious symptoms or relation to food. Considered IBS however given his symptoms appear to be more related to his anxiety will focus treatment on his anxiety and follow up if persists.  Follow up virtual appt scheduled to discuss anxiety on 06/15/20.

## 2020-06-14 ENCOUNTER — Encounter: Payer: Self-pay | Admitting: Family Medicine

## 2020-06-15 ENCOUNTER — Telehealth (INDEPENDENT_AMBULATORY_CARE_PROVIDER_SITE_OTHER): Payer: 59 | Admitting: Family Medicine

## 2020-06-15 DIAGNOSIS — R5383 Other fatigue: Secondary | ICD-10-CM | POA: Diagnosis not present

## 2020-06-15 DIAGNOSIS — F411 Generalized anxiety disorder: Secondary | ICD-10-CM | POA: Diagnosis not present

## 2020-06-15 MED ORDER — CITALOPRAM HYDROBROMIDE 20 MG PO TABS: 20.0000 mg | ORAL_TABLET | Freq: Every day | ORAL | 0 refills | Status: DC

## 2020-06-15 MED ORDER — HYDROXYZINE HCL 50 MG PO TABS: 50.0000 mg | ORAL_TABLET | Freq: Three times a day (TID) | ORAL | 0 refills | Status: DC | PRN

## 2020-06-15 MED FILL — hydrOXYzine HCL 50 MG TABS: 50 | 5 days supply | Qty: 30 | Fill #0

## 2020-06-15 MED FILL — CITALOPRAM HBR 20 MG TABLET: 20 | 30 days supply | Qty: 30 | Fill #0

## 2020-06-15 NOTE — Assessment & Plan Note (Signed)
Endorsing low energy and decreased drive. May be related to severe anxiety and effect on sleep (reports getting ~6 hours of interrupted sleep from anxiety). History doesn't support sleep apnea at this time.  Suspect improvement in fatigue with management of his anxiety, however will obtain TSH, CBC, ferritin level, and testosterone level per patient request to evaluate for alternative causes.  - follow up labs and treat If indicated - see plan for anxiety

## 2020-06-15 NOTE — Progress Notes (Signed)
[facility] Telemedicine Visit  I connected with Danny Edwards on 06/15/20 at 10:50 AM EDT by a video enabled telemedicine application and verified that I am speaking with the correct person using two identifiers.     I discussed the limitations of evaluation and management by telemedicine and the availability of in person appointments.  I discussed that the purpose of this telehealth visit is to provide medical care while limiting exposure to the novel coronavirus.  The patient expressed understanding and agreed to proceed.  Patient consented to have virtual visit. Method of visit: Video  Encounter participants: Patient: Danny Edwards - located at Home Provider: Joana Reamer - located at [facility] Others (if applicable): None  Chief Complaint: Anxiety  HPI: Anxiety State  Fatigue: Patient notes that he has been experiencing low energy, decreased drive. He feels excessively tired all the time. He feels he could nap throughout the day if he could. He feels his racing thoughts affect his ability to sleep at night. He gets an average of 6 hours of sleep per night. He notes that he wakes up multiple times a night - sometimes in a panic. Patient notes he had a history of snoring but this has actually resolved recently. His symptoms have not improved with less snoring. Patient denies report of episodes of apnea. Denies feeling of SOB or tiredness with exertion. Patient endorses family history of low testosterone. Decreased sex drive. Denies constipation, brittle nails, or skin changes.   GAD 7 : Generalized Anxiety Score 06/08/2020  Nervous, Anxious, on Edge 2  Control/stop worrying 3  Worry too much - different things 3  Trouble relaxing 1  Restless 0  Easily annoyed or irritable 3  Afraid - awful might happen 3  Total GAD 7 Score 15  Anxiety Difficulty Somewhat difficult   Depression screen [facility] 2/9 06/08/2020 06/08/2020 11/25/2018  Decreased Interest 3 0 0  Down,  Depressed, Hopeless 0 0 0  PHQ - 2 Score 3 0 0  Altered sleeping 1 - -  Tired, decreased energy 1 - -  Change in appetite 0 - -  Feeling bad or failure about yourself  1 - -  Trouble concentrating 0 - -  Moving slowly or fidgety/restless 1 - -  Suicidal thoughts 0 - -  PHQ-9 Score 7 - -  Difficult doing work/chores Somewhat difficult - -   ROS: per HPI  Pertinent PMHx: ADHD  Exam:  Respiratory: breathing comfortably on room air, speaking in full sentences  Assessment/Plan:  Anxiety state Patient endorsing generalized anxiety with intermittent symptoms consistent with panic attacks. GAD-7 score of 15 indicating severe anxiety. He endorses difficulty with sleep due to anxiety leading to day time fatigue. Patient is open to starting medication. He declines therapy at this time. Denies SI/HI.  - start Celexa 20mg  QD - follow up in 2 weeks to ensure tolerability and in 4 weeks to monitor for improvement - Atarax 50-100mg  PRN for break through anxiety, panic attacks, or insomnia.   Other fatigue Endorsing low energy and decreased drive. May be related to severe anxiety and effect on sleep (reports getting ~6 hours of interrupted sleep from anxiety). History doesn't support sleep apnea at this time.  Suspect improvement in fatigue with management of his anxiety, however will obtain TSH, CBC, ferritin level, and testosterone level per patient request to evaluate for alternative causes.  - follow up labs and treat If indicated - see plan for anxiety   I discussed  the assessment and treatment plan with the patient and/or parent/guardian. They were provided an opportunity to ask questions and all were answered. They agreed with the plan and demonstrated an understanding of the instructions.   They were advised to call back or seek an in-person evaluation in the emergency room if the symptoms worsen or if the condition fails to improve as anticipated.  Total time: 25 minutes. This includes  time spent with the patient during the visit as well as time spent before and after the visit reviewing the chart, documenting the encounter, making phone calls, reviewing studies, etc.  Joana Reamer, DO  [facility], PGY2 06/15/2020 4:29 PM

## 2020-06-15 NOTE — Assessment & Plan Note (Signed)
Patient endorsing generalized anxiety with intermittent symptoms consistent with panic attacks. GAD-7 score of 15 indicating severe anxiety. He endorses difficulty with sleep due to anxiety leading to day time fatigue. Patient is open to starting medication. He declines therapy at this time. Denies SI/HI.  - start Celexa 20mg  QD - follow up in 2 weeks to ensure tolerability and in 4 weeks to monitor for improvement - Atarax 50-100mg  PRN for break through anxiety, panic attacks, or insomnia.

## 2020-06-17 DIAGNOSIS — F1120 Opioid dependence, uncomplicated: Secondary | ICD-10-CM | POA: Diagnosis not present

## 2020-06-20 DIAGNOSIS — F1120 Opioid dependence, uncomplicated: Secondary | ICD-10-CM | POA: Diagnosis not present

## 2020-06-22 ENCOUNTER — Other Ambulatory Visit: Payer: 59

## 2020-06-25 ENCOUNTER — Other Ambulatory Visit: Payer: 59

## 2020-06-25 ENCOUNTER — Other Ambulatory Visit: Payer: Self-pay

## 2020-06-25 DIAGNOSIS — R5383 Other fatigue: Secondary | ICD-10-CM | POA: Diagnosis not present

## 2020-06-27 DIAGNOSIS — F1120 Opioid dependence, uncomplicated: Secondary | ICD-10-CM | POA: Diagnosis not present

## 2020-06-28 ENCOUNTER — Encounter: Payer: Self-pay | Admitting: Sports Medicine

## 2020-06-28 ENCOUNTER — Ambulatory Visit (INDEPENDENT_AMBULATORY_CARE_PROVIDER_SITE_OTHER): Payer: 59 | Admitting: Sports Medicine

## 2020-06-28 DIAGNOSIS — M25512 Pain in left shoulder: Secondary | ICD-10-CM | POA: Diagnosis not present

## 2020-06-28 MED ORDER — MELOXICAM 15 MG PO TABS: ORAL_TABLET | ORAL | 3 refills | Status: DC

## 2020-06-28 MED FILL — MELOXICAM 15 MG TABLET: 15 | 30 days supply | Qty: 30 | Fill #0

## 2020-06-28 NOTE — Assessment & Plan Note (Signed)
Danny Edwards is a pleasant 34 year old male, he works for [employer], currently he has been working from home, no heavy lifting. He pushed a couple of objects, and started to feel severe pain over the anterior aspect of the shoulder with some swelling. On exam he has minimal tenderness over the bicipital groove, most examiners are unremarkable, he does however have a positive Yergason sign that reproduces his pain all consistent with biceps tendinitis. We will start conservative, switch from ibuprofen to meloxicam and adding formal physical therapy, return in a month, we will do a biceps sheath injection if no better.

## 2020-06-28 NOTE — Progress Notes (Signed)
    Procedures performed today:    None.  Independent interpretation of notes and tests performed by another provider:   None.  Brief History, Exam, Impression, and Recommendations:    Acute pain of left shoulder Danny Edwards is a pleasant 34 year old male, he works for [employer], currently he has been working from home, no heavy lifting. He pushed a couple of objects, and started to feel severe pain over the anterior aspect of the shoulder with some swelling. On exam he has minimal tenderness over the bicipital groove, most examiners are unremarkable, he does however have a positive Yergason sign that reproduces his pain all consistent with biceps tendinitis. We will start conservative, switch from ibuprofen to meloxicam and adding formal physical therapy, return in a month, we will do a biceps sheath injection if no better.    ___________________________________________ [name]. Benjamin Stain, M.D., ABFM., CAQSM. Primary Care and Sports Medicine [facility]  Adjunct Instructor of Family Medicine  [employer]

## 2020-06-29 LAB — FERRITIN: Ferritin: 64 ng/mL (ref 30–400)

## 2020-06-29 LAB — TESTOSTERONE,FREE AND TOTAL
Testosterone, Free: 7.9 pg/mL — ABNORMAL LOW (ref 8.7–25.1)
Testosterone: 248 ng/dL — ABNORMAL LOW (ref 264–916)

## 2020-06-29 LAB — CBC
Hematocrit: 46.7 % (ref 37.5–51.0)
Hemoglobin: 15.8 g/dL (ref 13.0–17.7)
MCH: 31.3 pg (ref 26.6–33.0)
MCHC: 33.8 g/dL (ref 31.5–35.7)
MCV: 93 fL (ref 79–97)
Platelets: 250 10*3/uL (ref 150–450)
RBC: 5.05 x10E6/uL (ref 4.14–5.80)
RDW: 12.1 % (ref 11.6–15.4)
WBC: 6 10*3/uL (ref 3.4–10.8)

## 2020-06-29 LAB — IRON AND TIBC
Iron Saturation: 20 % (ref 15–55)
Iron: 62 ug/dL (ref 38–169)
Total Iron Binding Capacity: 304 ug/dL (ref 250–450)
UIBC: 242 ug/dL (ref 111–343)

## 2020-06-29 LAB — TSH: TSH: 1.45 u[IU]/mL (ref 0.450–4.500)

## 2020-07-01 ENCOUNTER — Telehealth (INDEPENDENT_AMBULATORY_CARE_PROVIDER_SITE_OTHER): Payer: 59 | Admitting: Family Medicine

## 2020-07-01 DIAGNOSIS — R7989 Other specified abnormal findings of blood chemistry: Secondary | ICD-10-CM

## 2020-07-01 DIAGNOSIS — R5383 Other fatigue: Secondary | ICD-10-CM

## 2020-07-01 DIAGNOSIS — F411 Generalized anxiety disorder: Secondary | ICD-10-CM | POA: Diagnosis not present

## 2020-07-01 NOTE — Assessment & Plan Note (Signed)
Work up normal other than low testosterone. Focus on treatment of anxiety with hope that improved sleep will improve his day time fatigue. Will also further work up low testosterone level.  - plan as above

## 2020-07-01 NOTE — Assessment & Plan Note (Signed)
Testosterone and free testosterone level low.  - obtain repeat AM level in 1 week - if persistently low, plan to obtain FSH/LH and repeat level to confirm diagnosis

## 2020-07-01 NOTE — Progress Notes (Signed)
[facility] Telemedicine Visit  I connected with Danny Edwards on 07/01/20 at  9:50 AM EDT by a video enabled telemedicine application and verified that I am speaking with the correct person using two identifiers.     I discussed the limitations of evaluation and management by telemedicine and the availability of in person appointments.  I discussed that the purpose of this telehealth visit is to provide medical care while limiting exposure to the novel coronavirus.  The patient expressed understanding and agreed to proceed.  Patient consented to have virtual visit. Method of visit: Video  Encounter participants: Patient: Danny Edwards - located at home Provider: Joana Reamer - located at [facility] Others (if applicable): None   Chief Complaint: Anxiety Follow up  HPI: Anxiety Follow up: Patient here to follow up for anxiety. He was started on Celexa 20mg  QD. He started taking it officially on 7/12. He was also started on the Hydroxyzine for sleep and as needed for periods of increased anxiety. At this time he is just using it at night. He notes it has been helpful. Denies any SI/HI.   ROS: per HPI  Pertinent PMHx: anxiety, fatigue  Exam:  Respiratory: Breathing comfortably on room air, speaking in full sentences  Assessment/Plan:  Anxiety state Chronic. Patient tolerating Celexa 20mg  QD and Hydroxyzine qHS for sleep. Denies any SI/HI.  - continue Celexa 20mg  QD - continue Hydroxyzine qHS - Follow up 07/29/20 to monitor tolerability and effectiveness. Can consider increase in dose if indicated - Safety plan discussed with the patient. They are aware of how to contact crisis services if need be and instructed to go to the nearest emergency room if they feel they are in imminent danger of harming themselves and or others, or symptoms are of out control or unbearable.   Low testosterone in male Testosterone and free testosterone level low.  - obtain repeat AM  level in 1 week - if persistently low, plan to obtain FSH/LH and repeat level to confirm diagnosis   Other fatigue Work up normal other than low testosterone. Focus on treatment of anxiety with hope that improved sleep will improve his day time fatigue. Will also further work up low testosterone level.  - plan as above   I discussed the assessment and treatment plan with the patient and/or parent/guardian. They were provided an opportunity to ask questions and all were answered. They agreed with the plan and demonstrated an understanding of the instructions.   They were advised to call back or seek an in-person evaluation in the emergency room if the symptoms worsen or if the condition fails to improve as anticipated.  Total time: 21 minutes. This includes time spent with the patient during the visit as well as time spent before and after the visit reviewing the chart, documenting the encounter, making phone calls, reviewing studies, etc.  , DO  [facility], PGY2 07/01/2020 4:38 PM

## 2020-07-01 NOTE — Assessment & Plan Note (Addendum)
Chronic. Patient tolerating Celexa 20mg  QD and Hydroxyzine qHS for sleep. Denies any SI/HI.  - continue Celexa 20mg  QD - continue Hydroxyzine qHS - Follow up 07/29/20 to monitor tolerability and effectiveness. Can consider increase in dose if indicated - Safety plan discussed with the patient. They are aware of how to contact crisis services if need be and instructed to go to the nearest emergency room if they feel they are in imminent danger of harming themselves and or others, or symptoms are of out control or unbearable.

## 2020-07-04 DIAGNOSIS — F1120 Opioid dependence, uncomplicated: Secondary | ICD-10-CM | POA: Diagnosis not present

## 2020-07-07 ENCOUNTER — Other Ambulatory Visit: Payer: 59

## 2020-07-11 DIAGNOSIS — F1120 Opioid dependence, uncomplicated: Secondary | ICD-10-CM | POA: Diagnosis not present

## 2020-07-15 ENCOUNTER — Telehealth: Payer: 59 | Admitting: Family Medicine

## 2020-07-18 DIAGNOSIS — F1120 Opioid dependence, uncomplicated: Secondary | ICD-10-CM | POA: Diagnosis not present

## 2020-07-21 ENCOUNTER — Other Ambulatory Visit: Payer: Self-pay | Admitting: Family Medicine

## 2020-07-21 DIAGNOSIS — F900 Attention-deficit hyperactivity disorder, predominantly inattentive type: Secondary | ICD-10-CM

## 2020-07-21 DIAGNOSIS — F411 Generalized anxiety disorder: Secondary | ICD-10-CM

## 2020-07-22 MED ORDER — CITALOPRAM HYDROBROMIDE 20 MG PO TABS: 20.0000 mg | ORAL_TABLET | Freq: Every day | ORAL | 2 refills | Status: DC

## 2020-07-22 MED ORDER — HYDROXYZINE HCL 50 MG PO TABS: 50.0000 mg | ORAL_TABLET | Freq: Three times a day (TID) | ORAL | 1 refills | Status: DC | PRN

## 2020-07-22 MED ORDER — AMPHETAMINE-DEXTROAMPHET ER 30 MG PO CP24: 30.0000 mg | ORAL_CAPSULE | Freq: Every morning | ORAL | 0 refills | Status: DC

## 2020-07-22 MED ORDER — AMPHETAMINE-DEXTROAMPHETAMINE 30 MG PO TABS: 30.0000 mg | ORAL_TABLET | Freq: Every day | ORAL | 0 refills | Status: DC

## 2020-07-23 MED FILL — CITALOPRAM HBR 20 MG TABLET: 20 | 30 days supply | Qty: 30 | Fill #0

## 2020-07-23 MED FILL — hydrOXYzine HCL 50 MG TABS: 50 | 10 days supply | Qty: 60 | Fill #0

## 2020-07-23 MED FILL — AMPHETAMINE-DEXTROAMPHETAMI: 30 | 30 days supply | Qty: 30 | Fill #0

## 2020-07-23 MED FILL — ADDERALL XR 30 MG CAP SA: 30 | 30 days supply | Qty: 30 | Fill #0

## 2020-07-25 DIAGNOSIS — F1120 Opioid dependence, uncomplicated: Secondary | ICD-10-CM | POA: Diagnosis not present

## 2020-07-29 ENCOUNTER — Telehealth: Payer: 59 | Admitting: Family Medicine

## 2020-08-01 DIAGNOSIS — F1120 Opioid dependence, uncomplicated: Secondary | ICD-10-CM | POA: Diagnosis not present

## 2020-08-08 DIAGNOSIS — F1120 Opioid dependence, uncomplicated: Secondary | ICD-10-CM | POA: Diagnosis not present

## 2020-08-15 DIAGNOSIS — F1120 Opioid dependence, uncomplicated: Secondary | ICD-10-CM | POA: Diagnosis not present

## 2020-08-17 ENCOUNTER — Encounter: Payer: Self-pay | Admitting: Family Medicine

## 2020-08-22 DIAGNOSIS — F1120 Opioid dependence, uncomplicated: Secondary | ICD-10-CM | POA: Diagnosis not present

## 2020-08-27 ENCOUNTER — Other Ambulatory Visit: Payer: Self-pay | Admitting: Family Medicine

## 2020-08-27 DIAGNOSIS — F900 Attention-deficit hyperactivity disorder, predominantly inattentive type: Secondary | ICD-10-CM

## 2020-08-29 DIAGNOSIS — F1120 Opioid dependence, uncomplicated: Secondary | ICD-10-CM | POA: Diagnosis not present

## 2020-08-30 MED ORDER — AMPHETAMINE-DEXTROAMPHETAMINE 30 MG PO TABS: 30.0000 mg | ORAL_TABLET | Freq: Every day | ORAL | 0 refills | Status: DC

## 2020-08-30 MED ORDER — AMPHETAMINE-DEXTROAMPHET ER 30 MG PO CP24: 30.0000 mg | ORAL_CAPSULE | Freq: Every morning | ORAL | 0 refills | Status: DC

## 2020-08-30 MED FILL — AMPHETAMINE-DEXTROAMPHETAMI: 30 | 30 days supply | Qty: 30 | Fill #0

## 2020-08-30 MED FILL — ADDERALL XR 30 MG CAP SA: 30 | 30 days supply | Qty: 30 | Fill #0

## 2020-09-05 DIAGNOSIS — F1120 Opioid dependence, uncomplicated: Secondary | ICD-10-CM | POA: Diagnosis not present

## 2020-09-09 DIAGNOSIS — F1120 Opioid dependence, uncomplicated: Secondary | ICD-10-CM | POA: Diagnosis not present

## 2020-09-12 DIAGNOSIS — F1120 Opioid dependence, uncomplicated: Secondary | ICD-10-CM | POA: Diagnosis not present

## 2020-09-19 DIAGNOSIS — F1120 Opioid dependence, uncomplicated: Secondary | ICD-10-CM | POA: Diagnosis not present

## 2020-09-26 DIAGNOSIS — F1120 Opioid dependence, uncomplicated: Secondary | ICD-10-CM | POA: Diagnosis not present

## 2020-10-03 DIAGNOSIS — F1120 Opioid dependence, uncomplicated: Secondary | ICD-10-CM | POA: Diagnosis not present

## 2020-10-04 ENCOUNTER — Ambulatory Visit: Payer: 59 | Admitting: Family Medicine

## 2020-10-04 NOTE — Progress Notes (Deleted)
   Subjective:   Patient ID: Danny Edwards    DOB: 1986/02/15, 34 y.o. male   MRN: [id number]  Danny Edwards is a 34 y.o. male with a history of *** here for ***  ADHD: Currently on Adderall XR 30mg  QD in AM and Adderall IR 30mg  in PM. Endorses compliance. Denies difficulty with sleep or appetite. Denies weight loss. Patient receives monthly drug tests at [facility] [place]. Patient to have forwarded to [facility].   Low testosterone in male:  Patient requested levels due to fatigue. Work up for fatigue unremarkable except for anxiety likely contributing plus concern for possible OSA. Testosterone and free testosterone level low. Patient never had repeat labs completed ordered.  Anxiety state: Chronic. Currently taking Celexa 20mg  QD and Hydroxyzine qHS for sleep. Notes that the Celexa makes him feel funny however notes the Hydroxyzine helps with sleep. Denies any SI/HI. Would like to talk about starting an alternative medicine. Does note seeing a psychiatrist?  Review of Systems:  Per HPI.   Objective:   There were no vitals taken for this visit. Vitals and nursing note reviewed.  General: pleasant ***, sitting comfortably in exam chair, well nourished, well developed, in no acute distress with non-toxic appearance HEENT: normocephalic, atraumatic, moist mucous membranes, oropharynx clear without erythema or exudate, TM normal bilaterally  Neck: supple, non-tender without lymphadenopathy CV: regular rate and rhythm without murmurs, rubs, or gallops, no lower extremity edema, 2+ radial and pedal pulses bilaterally Lungs: clear to auscultation bilaterally with normal work of breathing on room air Resp: breathing comfortably on room air, speaking in full sentences Abdomen: soft, non-tender, non-distended, no masses or organomegaly palpable, normoactive bowel sounds Skin: warm, dry, no rashes or lesions Extremities: warm and well perfused, normal tone MSK: ROM grossly  intact, strength intact, gait normal Neuro: Alert and oriented, speech normal  Assessment & Plan:   No problem-specific Assessment & Plan notes found for this encounter.  No orders of the defined types were placed in this encounter.  No orders of the defined types were placed in this encounter.  Low testosterone in male: Testosterone and free testosterone level low - obtain repeat AM level in 1 week - if persistently low, plan to obtain FSH/LH and repeat level to confirm diagnosis   [place], DO PGY-3, [facility] 10/04/2020 7:57 AM

## 2020-10-10 DIAGNOSIS — F1120 Opioid dependence, uncomplicated: Secondary | ICD-10-CM | POA: Diagnosis not present

## 2020-10-15 ENCOUNTER — Ambulatory Visit: Payer: 59 | Admitting: Family Medicine

## 2020-10-17 DIAGNOSIS — F1120 Opioid dependence, uncomplicated: Secondary | ICD-10-CM | POA: Diagnosis not present

## 2020-10-24 DIAGNOSIS — F1120 Opioid dependence, uncomplicated: Secondary | ICD-10-CM | POA: Diagnosis not present

## 2020-10-24 NOTE — Progress Notes (Signed)
Subjective:   Patient ID: Danny Edwards    DOB: May 19, 1986, 34 y.o. male   MRN: [id number]  Danny Edwards is a 34 y.o. male with a history of ADHD, anxiety, low testosterone level, HLD, fatigue, nocturnal polyuria, tobacco use dirsoer here for medication management.  ADHD: Currently on Adderall XR 30mg  QD in AM and Adderall IR 30mg  in PM. Endorses compliance. Denies difficulty with sleep or appetite. Denies weight loss. Patient receives monthly drug tests at [facility]. Patient to have forwarded to [facility].   Low testosterone in male:  Patient requested levels due to fatigue. Work up for fatigue unremarkable except for anxiety likely contributing plus concern for possible OSA. Testosterone and free testosterone level low. Patient never had repeat labs completed ordered. He has started to work out more and feel like his fatigue is improving.   Anxiety state: Chronic. Currently taking Celexa 20mg  QD and Hydroxyzine qHS for sleep. Notes that the Celexa makes him feel funny however notes the Hydroxyzine helps with sleep. Denies any SI/HI. Has stopped taking the Celexa. Would like to just continue the Hydroxyzine as it helps for the as needed anxiety and sleep.  Nocturnal polyuria  Resolved. Was going 3-4 times a night now only going 1x/night.  Tobacco Abuse:  Quit 2 weeks. Wife is pregnant and she has had to stop.   Review of Systems:  Per HPI.   Objective:   BP 105/70   Pulse 69   Ht 5\' 9"  (1.753 m)   Wt 225 lb (102.1 kg)   SpO2 99%   BMI 33.23 kg/m  Vitals and nursing note reviewed.  General: pleasant young male, sitting comfortably in exam chair, well nourished, well developed, in no acute distress with non-toxic appearance Resp: breathing comfortably on room air, speaking in full sentences Neuro: Alert and oriented, speech normal  Assessment & Plan:   Nocturnal polyuria Resolved.  Going 1x/night. No further work up at this time.  Anxiety  state Adverse effects with Celexa. Self discontinued. Intermittent symptoms of anxiety well treated with PRN Hydroxyzine. - stop Celexa - continue PRN Hydroxyzine   Tobacco use disorder Quit 2 weeks ago as wife as had to stop too due to pregnancy. Congratulated patient on success. Desires to continue cold [place]. - encouraged to call if desires nicotine replacement therapy.  ADHD, predominantly inattentive type Chronic, well controlled. No adverse effects. [facility] - continue Adderall XR 30mg  QD + Adderall IR 30mg  QD - follow up in 3 months  Other fatigue Improving. Endorses improvement with physical activity. Encouraged to keep up the good work. Follow up if worsening.  Low testosterone in male Given fatigue improving, patient opted against further evaluation. Follow up if further work up desired.   Orders Placed This Encounter  Procedures  . Flu Vaccine QUAD 36+ mos IM   Meds ordered this encounter  Medications  . hydrOXYzine (ATARAX/VISTARIL) 50 MG tablet    Sig: Take 1-2 tablets (50-100 mg total) by mouth 3 (three) times daily as needed for anxiety (or insomnia).    Dispense:  90 tablet    Refill:  1  . amphetamine-dextroamphetamine (ADDERALL) 30 MG tablet    Sig: Take 1 tablet by mouth daily.    Dispense:  30 tablet    Refill:  0  . amphetamine-dextroamphetamine (ADDERALL XR) 30 MG 24 hr capsule    Sig: Take 1 capsule (30 mg total) by mouth every morning.    Dispense:  30 capsule    Refill:  0   Orpah Cobb, DO PGY-3, [facility] 10/26/2020 7:29 AM

## 2020-10-25 ENCOUNTER — Ambulatory Visit: Payer: 59 | Admitting: Family Medicine

## 2020-10-25 ENCOUNTER — Other Ambulatory Visit: Payer: Self-pay | Admitting: Family Medicine

## 2020-10-25 ENCOUNTER — Encounter: Payer: Self-pay | Admitting: Family Medicine

## 2020-10-25 ENCOUNTER — Other Ambulatory Visit: Payer: Self-pay

## 2020-10-25 VITALS — BP 105/70 | HR 69 | Ht 69.0 in | Wt 225.0 lb

## 2020-10-25 DIAGNOSIS — Z23 Encounter for immunization: Secondary | ICD-10-CM

## 2020-10-25 DIAGNOSIS — R3581 Nocturnal polyuria: Secondary | ICD-10-CM | POA: Diagnosis not present

## 2020-10-25 DIAGNOSIS — F411 Generalized anxiety disorder: Secondary | ICD-10-CM

## 2020-10-25 DIAGNOSIS — R7989 Other specified abnormal findings of blood chemistry: Secondary | ICD-10-CM

## 2020-10-25 DIAGNOSIS — F900 Attention-deficit hyperactivity disorder, predominantly inattentive type: Secondary | ICD-10-CM

## 2020-10-25 DIAGNOSIS — R5383 Other fatigue: Secondary | ICD-10-CM

## 2020-10-25 DIAGNOSIS — F17200 Nicotine dependence, unspecified, uncomplicated: Secondary | ICD-10-CM | POA: Diagnosis not present

## 2020-10-25 MED ORDER — AMPHETAMINE-DEXTROAMPHET ER 30 MG PO CP24: 30.0000 mg | ORAL_CAPSULE | Freq: Every morning | ORAL | 0 refills | Status: DC

## 2020-10-25 MED ORDER — HYDROXYZINE HCL 50 MG PO TABS: 50.0000 mg | ORAL_TABLET | Freq: Three times a day (TID) | ORAL | 1 refills | Status: DC | PRN

## 2020-10-25 MED ORDER — AMPHETAMINE-DEXTROAMPHETAMINE 30 MG PO TABS: 30.0000 mg | ORAL_TABLET | Freq: Every day | ORAL | 0 refills | Status: DC

## 2020-10-25 MED FILL — hydrOXYzine HCL 50 MG TABS: 50 | 15 days supply | Qty: 90 | Fill #0

## 2020-10-25 MED FILL — AMPHETAMINE SALTS 30 MG TAB: 30 | 30 days supply | Qty: 30 | Fill #0

## 2020-10-25 MED FILL — ADDERALL XR 30 MG CAP SA: 30 | 30 days supply | Qty: 30 | Fill #0

## 2020-10-25 NOTE — Assessment & Plan Note (Signed)
Quit 2 weeks ago as wife as had to stop too due to pregnancy. Congratulated patient on success. Desires to continue cold [place]. - encouraged to call if desires nicotine replacement therapy.

## 2020-10-25 NOTE — Patient Instructions (Signed)
It was a pleasure to see you today!  Thank you for choosing [facility] for your primary care.  Danny Edwards was seen for med management  Our plans for today were:  Stop Celexa  Continue hydroxyzine as needed for anxiety and sleep  Congratulations on stopping smoking! Let me know if there is anything I can do to help support you  Congratulations on your pregnancy  You should return to our clinic in 3 months for med management.   Best Wishes,   Orpah Cobb, DO

## 2020-10-25 NOTE — Assessment & Plan Note (Addendum)
Chronic, well controlled. No adverse effects. [facility] drug [facility]. - continue Adderall XR 30mg  QD + Adderall IR 30mg  QD - follow up in 3 months

## 2020-10-25 NOTE — Assessment & Plan Note (Signed)
Resolved.  Going 1x/night. No further work up at this time.

## 2020-10-25 NOTE — Assessment & Plan Note (Addendum)
Improving. Endorses improvement with physical activity. Encouraged to keep up the good work. Follow up if worsening.

## 2020-10-25 NOTE — Assessment & Plan Note (Signed)
Given fatigue improving, patient opted against further evaluation. Follow up if further work up desired.

## 2020-10-25 NOTE — Assessment & Plan Note (Signed)
Adverse effects with Celexa. Self discontinued. Intermittent symptoms of anxiety well treated with PRN Hydroxyzine. - stop Celexa - continue PRN Hydroxyzine

## 2020-10-31 DIAGNOSIS — F1120 Opioid dependence, uncomplicated: Secondary | ICD-10-CM | POA: Diagnosis not present

## 2020-11-04 DIAGNOSIS — F1120 Opioid dependence, uncomplicated: Secondary | ICD-10-CM | POA: Diagnosis not present

## 2020-11-07 DIAGNOSIS — F1120 Opioid dependence, uncomplicated: Secondary | ICD-10-CM | POA: Diagnosis not present

## 2020-11-14 DIAGNOSIS — F1120 Opioid dependence, uncomplicated: Secondary | ICD-10-CM | POA: Diagnosis not present

## 2020-11-21 DIAGNOSIS — F1120 Opioid dependence, uncomplicated: Secondary | ICD-10-CM | POA: Diagnosis not present

## 2020-11-28 DIAGNOSIS — F1120 Opioid dependence, uncomplicated: Secondary | ICD-10-CM | POA: Diagnosis not present

## 2020-12-02 DIAGNOSIS — F1120 Opioid dependence, uncomplicated: Secondary | ICD-10-CM | POA: Diagnosis not present

## 2020-12-03 ENCOUNTER — Other Ambulatory Visit: Payer: Self-pay | Admitting: Family Medicine

## 2020-12-03 DIAGNOSIS — F900 Attention-deficit hyperactivity disorder, predominantly inattentive type: Secondary | ICD-10-CM

## 2020-12-03 MED ORDER — AMPHETAMINE-DEXTROAMPHET ER 30 MG PO CP24
30.0000 mg | ORAL_CAPSULE | Freq: Every morning | ORAL | 0 refills | Status: DC
Start: 2020-12-03 — End: 2021-01-05

## 2020-12-03 MED ORDER — AMPHETAMINE-DEXTROAMPHETAMINE 30 MG PO TABS: 30.0000 mg | ORAL_TABLET | Freq: Every day | ORAL | 0 refills | Status: DC

## 2020-12-03 MED FILL — AMPHETAMINE SALTS 30 MG TAB: 30 | 30 days supply | Qty: 30 | Fill #0

## 2020-12-03 MED FILL — ADDERALL XR 30 MG CAP SA: 30 | 30 days supply | Qty: 30 | Fill #0

## 2020-12-05 DIAGNOSIS — F1120 Opioid dependence, uncomplicated: Secondary | ICD-10-CM | POA: Diagnosis not present

## 2020-12-18 DIAGNOSIS — F1120 Opioid dependence, uncomplicated: Secondary | ICD-10-CM | POA: Diagnosis not present

## 2020-12-19 DIAGNOSIS — F1120 Opioid dependence, uncomplicated: Secondary | ICD-10-CM | POA: Diagnosis not present

## 2020-12-26 DIAGNOSIS — F1120 Opioid dependence, uncomplicated: Secondary | ICD-10-CM | POA: Diagnosis not present

## 2020-12-30 DIAGNOSIS — F1120 Opioid dependence, uncomplicated: Secondary | ICD-10-CM | POA: Diagnosis not present

## 2021-01-02 DIAGNOSIS — F1120 Opioid dependence, uncomplicated: Secondary | ICD-10-CM | POA: Diagnosis not present

## 2021-01-05 ENCOUNTER — Encounter: Payer: Self-pay | Admitting: Family Medicine

## 2021-01-05 ENCOUNTER — Other Ambulatory Visit: Payer: Self-pay | Admitting: Family Medicine

## 2021-01-05 DIAGNOSIS — F411 Generalized anxiety disorder: Secondary | ICD-10-CM

## 2021-01-05 DIAGNOSIS — F900 Attention-deficit hyperactivity disorder, predominantly inattentive type: Secondary | ICD-10-CM

## 2021-01-06 ENCOUNTER — Other Ambulatory Visit: Payer: Self-pay | Admitting: Family Medicine

## 2021-01-06 MED ORDER — AMPHETAMINE-DEXTROAMPHET ER 30 MG PO CP24
30.0000 mg | ORAL_CAPSULE | Freq: Every morning | ORAL | 0 refills | Status: DC
Start: 2021-01-06 — End: 2021-02-08

## 2021-01-06 MED ORDER — HYDROXYZINE HCL 50 MG PO TABS
50.0000 mg | ORAL_TABLET | Freq: Three times a day (TID) | ORAL | 1 refills | Status: DC | PRN
Start: 2021-01-06 — End: 2021-02-16

## 2021-01-06 MED ORDER — AMPHETAMINE-DEXTROAMPHETAMINE 30 MG PO TABS: 30.0000 mg | ORAL_TABLET | Freq: Every day | ORAL | 0 refills | Status: DC

## 2021-01-06 MED FILL — AMPHETAMINE-DEXTROAMPHETAMI: 30 | 30 days supply | Qty: 30 | Fill #0

## 2021-01-06 MED FILL — hydrOXYzine HCL 50 MG TABS: 50 | 15 days supply | Qty: 90 | Fill #0

## 2021-01-06 MED FILL — ADDERALL XR 30 MG CAP SA: 30 | 30 days supply | Qty: 30 | Fill #0

## 2021-01-09 DIAGNOSIS — F1120 Opioid dependence, uncomplicated: Secondary | ICD-10-CM | POA: Diagnosis not present

## 2021-01-16 DIAGNOSIS — F1120 Opioid dependence, uncomplicated: Secondary | ICD-10-CM | POA: Diagnosis not present

## 2021-01-18 ENCOUNTER — Encounter: Payer: Self-pay | Admitting: Family Medicine

## 2021-01-18 ENCOUNTER — Ambulatory Visit: Payer: 59 | Admitting: Family Medicine

## 2021-01-18 ENCOUNTER — Other Ambulatory Visit: Payer: Self-pay

## 2021-01-18 DIAGNOSIS — M25512 Pain in left shoulder: Secondary | ICD-10-CM

## 2021-01-18 DIAGNOSIS — G8929 Other chronic pain: Secondary | ICD-10-CM | POA: Diagnosis not present

## 2021-01-18 NOTE — Progress Notes (Signed)
Office Visit Note   Patient: Danny Edwards           Date of Birth: 12-26-1985           MRN: [id number] Visit Date: 01/18/2021 Requested by: Joana Reamer, DO [address] [place],  [place] [address] PCP: Joana Reamer, DO  Subjective: Chief Complaint  Patient presents with  . Left Shoulder - Pain    Pain top of shoulder and into the proximal left clavicle since Memorial Day last year. Has been seen at urgent care in [place] and a specialist there. Would like a 2nd opinion today.    HPI: He is here with left shoulder pain.  He is right-hand dominant.  Onset of pain on memorial day this past year, he was sitting in the chair and pushed himself up using the handrails of the chair and felt immediate severe pain on top of his left shoulder.  He went to see an urgent care, he had x-rays obtained which were negative for bony abnormality.  He tried anti-inflammatories which did not seem to make much difference.  He has had overall some improvement in his pain, but he has intermittent flareups with certain lifting movements.  He is concerned because he and his wife will be having a baby in about 6 months and he wants to be able to lift his child without worrying about his shoulder.  Denies any neck pain, denies any numbness or tingling in his arm.                ROS:   All other systems were reviewed and are negative.  Objective: Vital Signs: There were no vitals taken for this visit.  Physical Exam:  General:  Alert and oriented, in no acute distress. Pulm:  Breathing unlabored. Psy:  Normal mood, congruent affect. Skin: No rash Left shoulder: He has full neck range of motion pain-free.  Full shoulder range of motion with no adhesive capsulitis.  5/5 rotator cuff, deltoid, biceps, triceps, wrist and intrinsic hand strength with no pain on resisted strength testing.  Minimal tenderness at the left [facility] joint but it does not reproduce his pain.  No significant pain at the  [facility] joint.  There is a symptomatic myofascial trigger point in the mid clavicle area just superior to the clavicle probably in the trapezius muscle and palpation of this reproduces his pain.    Imaging: No results found.  Assessment & Plan: 1.  Chronic left shoulder pain, suspect that he had a strain of the trapezius with subsequent development of symptomatic trigger point. -We will try myofascial release techniques in physical therapy in [place].  If he fails to improve, we will image the area with ultrasound and consider trigger point injection if indicated.     Procedures: No procedures performed        PMFS History: Patient Active Problem List   Diagnosis Date Noted  . Low testosterone in male 07/01/2020  . Mixed hyperlipidemia 06/13/2020  . Anxiety state 04/21/2016  . ADHD, predominantly inattentive type 01/25/2016  . Tobacco use disorder 01/25/2016   Past Medical History:  Diagnosis Date  . ADHD     Family History  Problem Relation Age of Onset  . Thyroid disease Mother   . Healthy Father   . Healthy Sister   . Healthy Brother   . Healthy Brother     Past Surgical History:  Procedure Laterality Date  . KNEE SURGERY Left 2008  ACL tear   Social History   Occupational History  . Not on file  Tobacco Use  . Smoking status: Current Every Day Smoker    Packs/day: 1.00    Years: 9.00    Pack years: 9.00    Types: Cigarettes  . Smokeless tobacco: Former Neurosurgeon    Types: Engineer, drilling  . Vaping Use: Never used  Substance and Sexual Activity  . Alcohol use: Yes    Alcohol/week: 1.0 standard drink    Types: 1 Cans of beer per week  . Drug use: No  . Sexual activity: Yes    Birth control/protection: Condom

## 2021-01-19 ENCOUNTER — Other Ambulatory Visit: Payer: Self-pay | Admitting: Family Medicine

## 2021-01-19 MED ORDER — NABUMETONE 500 MG PO TABS: 500.0000 mg | ORAL_TABLET | Freq: Two times a day (BID) | ORAL | 3 refills | Status: DC | PRN

## 2021-01-19 MED FILL — NABUMETONE 500 MG TABS: 500 | 30 days supply | Qty: 60 | Fill #0

## 2021-01-23 DIAGNOSIS — F1120 Opioid dependence, uncomplicated: Secondary | ICD-10-CM | POA: Diagnosis not present

## 2021-01-24 ENCOUNTER — Other Ambulatory Visit: Payer: Self-pay

## 2021-01-24 ENCOUNTER — Ambulatory Visit: Payer: 59 | Admitting: Rehabilitative and Restorative Service Providers"

## 2021-01-24 DIAGNOSIS — M25512 Pain in left shoulder: Secondary | ICD-10-CM

## 2021-01-24 DIAGNOSIS — R29898 Other symptoms and signs involving the musculoskeletal system: Secondary | ICD-10-CM | POA: Diagnosis not present

## 2021-01-24 DIAGNOSIS — G8929 Other chronic pain: Secondary | ICD-10-CM | POA: Diagnosis not present

## 2021-01-24 NOTE — Patient Instructions (Signed)
Access Code: [email or web address] URL: [email or web address] Date: 01/24/2021 Prepared by: Margretta Ditty  Exercises Seated Cervical Sidebending Stretch - 2 x daily - 7 x weekly - 1 sets - 3 reps - 20 seconds hold Sternocleidomastoid Stretch - 2 x daily - 7 x weekly - 1 sets - 3 reps - 20 seconds hold  Patient Education Trigger Point Dry Needling

## 2021-01-24 NOTE — Therapy (Signed)
[facility] [place] [address] [place], [place], [address] Phone: [phone]   Fax:  [phone]  Physical Therapy Evaluation  Patient Details  Name: Danny Edwards MRN: [id number] Date of Birth: 07/08/1986 Referring Provider (PT): Lavada Mesi, MD   Encounter Date: 01/24/2021   PT End of Session - 01/24/21 0943    Visit Number 1    Number of Visits 12    Date for PT Re-Evaluation 03/07/21    Authorization Type [place] choice plan    PT Start Time 0850    PT Stop Time 0935    PT Time Calculation (min) 45 min    Activity Tolerance Patient tolerated treatment well    Behavior During Therapy [facility] for tasks assessed/performed           Past Medical History:  Diagnosis Date  . ADHD     Past Surgical History:  Procedure Laterality Date  . KNEE SURGERY Left 2008   ACL tear    There were no vitals filed for this visit.    Subjective Assessment - 01/24/21 0853    Subjective The patient reports he pressed up from a chair last [facility] Day and got immediate L shoulder pain (above the clavicle).  He notes pain gets better and then reflares with use around the house.  At times, driving is painful.    Patient Stated Goals The patient has a child due in June and he wants to be able to carry a baby without shouldelr pain.    Currently in Pain? Yes    Pain Score --   not much   Pain Location Shoulder    Pain Orientation Left    Pain Descriptors / Indicators Aching;Sore;Sharp;Shooting    Pain Type Chronic pain    Pain Onset More than a month ago    Pain Frequency Intermittent    Aggravating Factors  Overhead lift, horizontal ab/adduction is irritating    Pain Relieving Factors rest    Effect of Pain on Daily Activities doesn'tuse the L UE as much              [facility] PT Assessment - 01/24/21 0857      Assessment   Medical Diagnosis chronic L shoulder pain    Referring Provider (PT) Lavada Mesi, MD    Onset  Date/Surgical Date 01/18/21   last year [facility] Day   Hand Dominance Right    Prior Therapy none      Precautions   Precautions None      Restrictions   Weight Bearing Restrictions No      Balance Screen   Has the patient fallen in the past 6 months No    Has the patient had a decrease in activity level because of a fear of falling?  No    Is the patient reluctant to leave their home because of a fear of falling?  No      Prior Function   Level of Independence Independent      Observation/Other Assessments   Focus on Therapeutic Outcomes (FOTO)  70% functional status score      Sensation   Light Touch Appears Intact      ROM / Strength   AROM / PROM / Strength AROM;Strength      AROM   Overall AROM  Within functional limits for tasks performed    Overall AROM Comments Notes a "tightness" L shoulder end range flexion and ER  Strength   Overall Strength Deficits    Strength Assessment Site Shoulder    Right/Left Shoulder Right;Left    Right Shoulder Flexion 5/5    Right Shoulder ABduction 5/5    Right Shoulder Internal Rotation 5/5    Right Shoulder External Rotation 5/5    Left Shoulder Flexion 5/5   awareness of tightness L proximal shoulder   Left Shoulder ABduction 5/5    Left Shoulder Internal Rotation 5/5    Left Shoulder External Rotation 5/5      Palpation   Palpation comment Significant tenderness and pain with palpation L levator, L upper trap, L parascapular musculature in lats and teres.  Also note some muscle guarding and shortening in scalenes.                      Objective measurements completed on examination: See above findings.       [facility] Adult PT Treatment/Exercise - 01/24/21 1001      Exercises   Exercises Neck      Neck Exercises: Seated   Lateral Flexion Right    Lateral Flexion Limitations for L upper trap stretch    Other Seated Exercise SCM stretch/scalene stretch seated    Other Seated Exercise seated shoulder  rolls      Modalities   Modalities Moist Heat      Moist Heat Therapy   Number Minutes Moist Heat 10 Minutes    Moist Heat Location Cervical      Manual Therapy   Manual Therapy Soft tissue mobilization    Manual therapy comments skilled palpation and assessment of response to STM and DN    Soft tissue mobilization L levator and upper trap            Trigger Point Dry Needling - 01/24/21 1000    Consent Given? Yes    Education Handout Provided Yes    Muscles Treated Head and Neck Upper trapezius;Levator scapulae    Dry Needling Comments left side    Upper Trapezius Response Twitch reponse elicited;Palpable increased muscle length    Levator Scapulae Response Palpable increased muscle length                PT Education - 01/24/21 0943    Education Details HEP    Person(s) Educated Patient    Methods Explanation;Demonstration;Handout    Comprehension Verbalized understanding;Returned demonstration               PT Long Term Goals - 01/24/21 0944      PT LONG TERM GOAL #1   Title The patient will be indep with HEP for stretching and strengthening.    Time 6    Period Weeks    Target Date 03/07/21      PT LONG TERM GOAL #2   Title The patient will report no pain with daily activities (driving, overhead lifting).    Time 6    Period Weeks    Target Date 03/07/21      PT LONG TERM GOAL #3   Title The patient will demo overhead lifting of 20 lbs without c/o L shoulder/cervical pain.    Time 6    Period Weeks    Target Date 03/07/21      PT LONG TERM GOAL #4   Title The patient will improve functional status score from 70% up to 80% to demo reduced perception of pain.    Time 6    Period Weeks  Target Date 03/07/21                  Plan - 01/24/21 0954    Clinical Impression Statement The patient is a 35 year old male s/p initial injury May 2021 when rising from a chair.  Symptoms improved, however he can re-irritate symptoms with  excessive use.  He presents today with significant tightness in L upper trap musculature, L levator and L scalenes.  He has good shoulder ROM and strength, but does note some tightness in superior aspect with end range motion.  PT initiated STM and DN today +stretching for HEP.  Patient tolerated well and we will progress with goal of return to prior functional status.    Personal Factors and Comorbidities Time since onset of injury/illness/exacerbation    Examination-Activity Limitations Lift;Reach Overhead    Examination-Participation Restrictions Driving;Yard Work    Stability/Clinical Decision Making Stable/Uncomplicated    Optometrist Low    Rehab Potential Good    PT Frequency 2x / week    PT Duration 6 weeks    PT Treatment/Interventions ADLs/Self Care Home Management;Dry needling;Manual techniques;Therapeutic activities;Therapeutic exercise;Patient/family education;Taping;Cryotherapy;Moist Heat;Electrical Stimulation;Iontophoresis 4mg /ml Dexamethasone;Passive range of motion    PT Next Visit Plan assess soft tissue response to DN, progress scapular depression/retraction strengthening, STM/DN for parascapular musculature and trap/levator, further develop HEP    PT Home Exercise Plan Access Code: [email or web address]    Consulted and Agree with Plan of Care Patient           Patient will benefit from skilled therapeutic intervention in order to improve the following deficits and impairments:  Pain,Impaired flexibility,Hypomobility,Postural dysfunction  Visit Diagnosis: Chronic left shoulder pain  Other symptoms and signs involving the musculoskeletal system     Problem List Patient Active Problem List   Diagnosis Date Noted  . Low testosterone in male 07/01/2020  . Mixed hyperlipidemia 06/13/2020  . Anxiety state 04/21/2016  . ADHD, predominantly inattentive type 01/25/2016  . Tobacco use disorder 01/25/2016    Syanne Looney, PT 01/24/2021, 10:03 AM  [facility] [address] [place], [place], [place] Phone: [phone]   Fax:  [phone]  Name: Danny Edwards MRN: [name] Date of Birth: 1986-02-16

## 2021-01-26 ENCOUNTER — Encounter: Payer: Self-pay | Admitting: Physical Therapy

## 2021-01-26 ENCOUNTER — Ambulatory Visit (INDEPENDENT_AMBULATORY_CARE_PROVIDER_SITE_OTHER): Payer: 59 | Admitting: Physical Therapy

## 2021-01-26 ENCOUNTER — Other Ambulatory Visit: Payer: Self-pay

## 2021-01-26 DIAGNOSIS — G8929 Other chronic pain: Secondary | ICD-10-CM | POA: Diagnosis not present

## 2021-01-26 DIAGNOSIS — M25512 Pain in left shoulder: Secondary | ICD-10-CM

## 2021-01-26 DIAGNOSIS — R29898 Other symptoms and signs involving the musculoskeletal system: Secondary | ICD-10-CM

## 2021-01-26 NOTE — Patient Instructions (Signed)
Access Code: [email or web address] URL: [email or web address] Date: 01/26/2021 Prepared by: [facility] [place]  Exercises Seated Cervical Sidebending Stretch - 2 x daily - 7 x weekly - 1 sets - 3 reps - 20 seconds hold First Rib Mobilization with Strap - 1 x daily - 7 x weekly - 3 reps - 15 seconds hold Sternocleidomastoid Stretch - 2 x daily - 7 x weekly - 1 sets - 3 reps - 20 seconds hold Shoulder External Rotation and Scapular Retraction with Resistance - 1 x daily - 3 x weekly - 2 sets - 10 reps Doorway Pec Stretch at 90 Degrees Abduction - 2 x daily - 7 x weekly - 1 sets - 2 reps - 15-20 hold Doorway Pec Stretch at 120 Degrees Abduction - 2 x daily - 7 x weekly - 1 sets - 2 reps - 15-20 hold Sidelying Thoracic Rotation with Open Book - 1 x daily - 7 x weekly - 1 sets - 5 reps - 5-10 seconds hold

## 2021-01-26 NOTE — Therapy (Signed)
[facility] [place] [address] [place], [place], [address] Phone: [phone]   Fax:  [phone]  Physical Therapy Treatment  Patient Details  Name: Danny [place] MRN: [id number] Date of Birth: March 19, 1986 Referring Provider (PT): Lavada Mesi, MD   Encounter Date: 01/26/2021   PT End of Session - 01/26/21 0919    Visit Number 2    Number of Visits 12    Date for PT Re-Evaluation 03/07/21    Authorization Type [place] choice plan    PT Start Time 0850    PT Stop Time 0928    PT Time Calculation (min) 38 min    Activity Tolerance Patient tolerated treatment well    Behavior During Therapy [facility] for tasks assessed/performed           Past Medical History:  Diagnosis Date   ADHD     Past Surgical History:  Procedure Laterality Date   KNEE SURGERY Left 2008   ACL tear    There were no vitals filed for this visit.   Subjective Assessment - 01/26/21 0851    Subjective Pt reports he tolerated DN well, with "an immediate release".  No soreness afterward.    Patient Stated Goals The patient has a child due in June and he wants to be able to carry a baby without shouldelr pain.    Currently in Pain? No/denies    Pain Score 0-No pain              [facility] PT Assessment - 01/26/21 0001      Assessment   Medical Diagnosis chronic L shoulder pain    Referring Provider (PT) Lavada Mesi, MD    Onset Date/Surgical Date 01/18/21   last year [facility]   Hand Dominance Right    Next MD Visit PRN    Prior Therapy none            [facility] Adult PT Treatment/Exercise - 01/26/21 0001      Self-Care   Self-Care Other Self-Care Comments    Other Self-Care Comments  Pt instructed in self massage to Lt shoulder (ant/post) with ball against wall. Pt returned demo with cues.  Pt shown MFR to Lt scalenes/ SCM; pt returned demo with cue.s      Neck Exercises: Seated   Lateral Flexion Right   3 reps, 20 sec   Other  Seated Exercise SCM stretch/scalene stretch seated x 3 reps    Other Seated Exercise seated, bilat shoulder ER with green band x 10      Neck Exercises: Sidelying   Other Sidelying Exercise open book x 3 reps on R side, 5 reps L side.      Neck Exercises: Stretches   Levator Stretch Left;1 rep;20 seconds    Other Neck Stretches 3 position doorway stretch x 15 sec x 2 reps each.  Lt first rib stabilized with Rt lateral flexion x 20 sec    Other Neck Stretches Lt bicep stretch holding counter x 20 sec                  PT Education - 01/26/21 0925    Education Details HEP updates; self massage.  images of neck/ant shoulder musculature for better understanding    Person(s) Educated Patient    Methods Explanation;Demonstration;Tactile cues;Verbal cues;Handout    Comprehension Verbalized understanding;Returned demonstration               PT Long Term Goals - 01/24/21 4010  PT LONG TERM GOAL #1   Title The patient will be indep with HEP for stretching and strengthening.    Time 6    Period Weeks    Target Date 03/07/21      PT LONG TERM GOAL #2   Title The patient will report no pain with daily activities (driving, overhead lifting).    Time 6    Period Weeks    Target Date 03/07/21      PT LONG TERM GOAL #3   Title The patient will demo overhead lifting of 20 lbs without c/o L shoulder/cervical pain.    Time 6    Period Weeks    Target Date 03/07/21      PT LONG TERM GOAL #4   Title The patient will improve functional status score from 70% up to 80% to demo reduced perception of pain.    Time 6    Period Weeks    Target Date 03/07/21                 Plan - 01/26/21 0909    Clinical Impression Statement Pt reported discomfort in Lt pec with high doorway stretch. He reported noticable difference in tightness in ant neck/pec in Lt vs Rt. He tolerated new stretches well, with 2nd reps.  Pt will benefit from additional DN/ manual to Lt shoulder to  decrease tissue restrictions. Goals are ongoing.    Personal Factors and Comorbidities Time since onset of injury/illness/exacerbation    Examination-Activity Limitations Lift;Reach Overhead    Examination-Participation Restrictions Driving;Yard Work    Stability/Clinical Decision Making Stable/Uncomplicated    Rehab Potential Good    PT Frequency 2x / week    PT Duration 6 weeks    PT Treatment/Interventions ADLs/Self Care Home Management;Dry needling;Manual techniques;Therapeutic activities;Therapeutic exercise;Patient/family education;Taping;Cryotherapy;Moist Heat;Electrical Stimulation;Iontophoresis 4mg /ml Dexamethasone;Passive range of motion    PT Next Visit Plan assess soft tissue response to DN, progress scapular depression/retraction strengthening, STM/DN for parascapular musculature and trap/levator, further develop HEP    PT Home Exercise Plan Access Code: [email or web address]    Consulted and Agree with Plan of Care Patient           Patient will benefit from skilled therapeutic intervention in order to improve the following deficits and impairments:  Pain,Impaired flexibility,Hypomobility,Postural dysfunction  Visit Diagnosis: Chronic left shoulder pain  Other symptoms and signs involving the musculoskeletal system     Problem List Patient Active Problem List   Diagnosis Date Noted   Low testosterone in male 07/01/2020   Mixed hyperlipidemia 06/13/2020   Anxiety state 04/21/2016   ADHD, predominantly inattentive type 01/25/2016   Tobacco use disorder 01/25/2016   03/24/2016, PTA 01/26/21 9:32 AM  [facility] [place] [address] [place], [place], [place] Phone: [phone]   Fax:  [phone]  Name: Danny [place] MRN: [name] Date of Birth: 1986/10/25

## 2021-01-30 DIAGNOSIS — F1120 Opioid dependence, uncomplicated: Secondary | ICD-10-CM | POA: Diagnosis not present

## 2021-02-01 ENCOUNTER — Encounter: Payer: Self-pay | Admitting: Physical Therapy

## 2021-02-01 ENCOUNTER — Ambulatory Visit: Payer: 59 | Admitting: Physical Therapy

## 2021-02-01 ENCOUNTER — Other Ambulatory Visit: Payer: Self-pay

## 2021-02-01 DIAGNOSIS — M25512 Pain in left shoulder: Secondary | ICD-10-CM | POA: Diagnosis not present

## 2021-02-01 DIAGNOSIS — R29898 Other symptoms and signs involving the musculoskeletal system: Secondary | ICD-10-CM | POA: Diagnosis not present

## 2021-02-01 DIAGNOSIS — G8929 Other chronic pain: Secondary | ICD-10-CM

## 2021-02-01 NOTE — Therapy (Signed)
[facility] [place] [address] [place], [place], [address] Phone: [phone]   Fax:  [phone]  Physical Therapy Treatment  Patient Details  Name: Danny [place] MRN: [id number] Date of Birth: [birth year] Referring Provider (PT): Lavada Mesi, MD   Encounter Date: 02/01/2021   PT End of Session - 02/01/21 0849    Visit Number 3    Number of Visits 12    Date for PT Re-Evaluation 03/07/21    Authorization Type [place] choice plan    PT Start Time 0850    PT Stop Time 0928    PT Time Calculation (min) 38 min    Activity Tolerance Patient tolerated treatment well    Behavior During Therapy [facility] for tasks assessed/performed           Past Medical History:  Diagnosis Date  . ADHD     Past Surgical History:  Procedure Laterality Date  . KNEE SURGERY Left 2008   ACL tear    There were no vitals filed for this visit.   Subjective Assessment - 02/01/21 0851    Subjective Pt reports he has been really sore in his Lt shoulder since last visit.  Otherwise no change in symptoms.    Patient Stated Goals The patient has a child due in June and he wants to be able to carry a baby without shoulder pain.    Currently in Pain? No/denies    Pain Score 0-No pain              [facility] PT Assessment - 02/01/21 0001      Assessment   Medical Diagnosis chronic L shoulder pain    Referring Provider (PT) Lavada Mesi, MD    Onset Date/Surgical Date 01/18/21   last year [facility]   Hand Dominance Right    Next MD Visit PRN    Prior Therapy none             [facility] Adult PT Treatment/Exercise - 02/01/21 0001      Neck Exercises: Standing   Upper Extremity D2 Flexion;10 reps;Theraband    Theraband Level (UE D2) Level 2 (Red)      Neck Exercises: Seated   Other Seated Exercise seated, bilat shoulder ER with green band x 10      Neck Exercises: Sidelying   Other Sidelying Exercise Open book Lt rotation, 3 reps x 10  sec, 2 reps with red, 2 reps with green.  On Rt - 1 rep no resistance, 1 with red, 1 with green.      Neck Exercises: Prone   W Back 10 reps   3-5 sec hold   W Back Limitations tactile cue for form.    Other Prone Exercise POE: cervical diagonals x 5 each direction for stretch.  then POE with LUE reaches x 8 reps      Manual Therapy   Soft tissue mobilization IASTM to Lt pec major, ant deltoid, levator, rhomboid to decrease fascial restrictions and improve mobility.      Neck Exercises: Stretches   Other Neck Stretches 3 position doorway stretch x 15 sec x 2 reps each.  bilat bicep stretch holding door frame with straight elbows, 20 sec x 2         Self care: pt shown theracane as option for self massage.  Pt returned demo of TPR with cues.       PT Long Term Goals - 01/24/21 9892  PT LONG TERM GOAL #1   Title The patient will be indep with HEP for stretching and strengthening.    Time 6    Period Weeks    Target Date 03/07/21      PT LONG TERM GOAL #2   Title The patient will report no pain with daily activities (driving, overhead lifting).    Time 6    Period Weeks    Target Date 03/07/21      PT LONG TERM GOAL #3   Title The patient will demo overhead lifting of 20 lbs without c/o L shoulder/cervical pain.    Time 6    Period Weeks    Target Date 03/07/21      PT LONG TERM GOAL #4   Title The patient will improve functional status score from 70% up to 80% to demo reduced perception of pain.    Time 6    Period Weeks    Target Date 03/07/21            HEP: pt issued red band and updated HEP. Pt verbalized understanding.      Plan - 02/01/21 0858    Clinical Impression Statement Pt noting improving in flexibility with Lt thoracic rotation open book, compared to last week.  He tolerated new exercises well, without increase in pain. Mild weakness in Lt periscapular musculature compared to Rt.  Persistant tightness found in Lt superior pec major fibers.   Responded well to IASTM to this area. Progressing towards goals.    Personal Factors and Comorbidities Time since onset of injury/illness/exacerbation    Examination-Activity Limitations Lift;Reach Overhead    Examination-Participation Restrictions Driving;Yard Work    Stability/Clinical Decision Making Stable/Uncomplicated    Rehab Potential Good    PT Frequency 2x / week    PT Duration 6 weeks    PT Treatment/Interventions ADLs/Self Care Home Management;Dry needling;Manual techniques;Therapeutic activities;Therapeutic exercise;Patient/family education;Taping;Cryotherapy;Moist Heat;Electrical Stimulation;Iontophoresis 4mg /ml Dexamethasone;Passive range of motion    PT Next Visit Plan assess soft tissue response to DN, progress scapular depression/retraction strengthening, STM/DN for parascapular musculature and trap/levator, further develop HEP    PT Home Exercise Plan Access Code: [email or web address]    Consulted and Agree with Plan of Care Patient           Patient will benefit from skilled therapeutic intervention in order to improve the following deficits and impairments:  Pain,Impaired flexibility,Hypomobility,Postural dysfunction  Visit Diagnosis: Chronic left shoulder pain  Other symptoms and signs involving the musculoskeletal system     Problem List Patient Active Problem List   Diagnosis Date Noted  . Low testosterone in male 07/01/2020  . Mixed hyperlipidemia 06/13/2020  . Anxiety state 04/21/2016  . ADHD, predominantly inattentive type 01/25/2016  . Tobacco use disorder 01/25/2016   03/24/2016, PTA 02/01/21 10:49 AM  [facility] [address] [place], [place], [place] Phone: [phone]   Fax:  [phone]  Name: KEVIS QU MRN: [name] Date of Birth: May 11, 1986

## 2021-02-03 ENCOUNTER — Encounter: Payer: 59 | Admitting: Physical Therapy

## 2021-02-06 DIAGNOSIS — F1120 Opioid dependence, uncomplicated: Secondary | ICD-10-CM | POA: Diagnosis not present

## 2021-02-07 ENCOUNTER — Other Ambulatory Visit: Payer: Self-pay

## 2021-02-07 ENCOUNTER — Encounter: Payer: Self-pay | Admitting: Rehabilitative and Restorative Service Providers"

## 2021-02-07 ENCOUNTER — Ambulatory Visit: Payer: 59 | Admitting: Rehabilitative and Restorative Service Providers"

## 2021-02-07 DIAGNOSIS — M25512 Pain in left shoulder: Secondary | ICD-10-CM | POA: Diagnosis not present

## 2021-02-07 DIAGNOSIS — G8929 Other chronic pain: Secondary | ICD-10-CM | POA: Diagnosis not present

## 2021-02-07 DIAGNOSIS — R29898 Other symptoms and signs involving the musculoskeletal system: Secondary | ICD-10-CM

## 2021-02-07 NOTE — Patient Instructions (Signed)
Access Code: [email or web address] URL: [email or web address] Date: 02/07/2021 Prepared by: Margretta Ditty  Exercises Seated Cervical Sidebending Stretch - 2 x daily - 7 x weekly - 1 sets - 3 reps - 20 seconds hold First Rib Mobilization with Strap - 1 x daily - 7 x weekly - 3 reps - 15 seconds hold Sternocleidomastoid Stretch - 2 x daily - 7 x weekly - 1 sets - 3 reps - 20 seconds hold Shoulder External Rotation and Scapular Retraction with Resistance - 1 x daily - 3 x weekly - 2 sets - 10 reps Doorway Pec Stretch at 90 Degrees Abduction - 2 x daily - 7 x weekly - 1 sets - 2 reps - 15-20 hold Doorway Pec Stretch at 120 Degrees Abduction - 2 x daily - 7 x weekly - 1 sets - 2 reps - 15-20 hold Sidelying Thoracic Rotation with Open Book - 1 x daily - 7 x weekly - 1 sets - 5 reps - 5-10 seconds hold Standing Shoulder Single Arm PNF D2 Flexion with Resistance - 1 x daily - 3 x weekly - 2 sets - 10 reps Prone Scapular Retraction Y - 2 x daily - 7 x weekly - 1 sets - 10 reps Prone on Elbows Thoracic Rotation - 2 x daily - 7 x weekly - 1 sets - 10 reps Supine Thoracic Mobilization Towel Roll Vertical with Arm Stretch - 2 x daily - 7 x weekly - 1 sets - 2 reps - 60 seconds hold

## 2021-02-07 NOTE — Therapy (Signed)
[facility] [place] [address] [place], [place], [address] Phone: [phone]   Fax:  [phone]  Physical Therapy Treatment  Patient Details  Name: Danny Edwards MRN: [id number] Date of Birth: 14-Apr-1986 Referring Provider (PT): Lavada Mesi, MD   Encounter Date: 02/07/2021   PT End of Session - 02/07/21 0806    Visit Number 4    Number of Visits 12    Date for PT Re-Evaluation 03/07/21    Authorization Type [place] choice plan    PT Start Time 0800    PT Stop Time 0843    PT Time Calculation (min) 43 min    Activity Tolerance Patient tolerated treatment well    Behavior During Therapy [facility] for tasks assessed/performed           Past Medical History:  Diagnosis Date  . ADHD     Past Surgical History:  Procedure Laterality Date  . KNEE SURGERY Left 2008   ACL tear    There were no vitals filed for this visit.   Subjective Assessment - 02/07/21 0804    Subjective The patient is sore in his L shoulder today.  He built a foundation at home over the weekend and feels muscle soreness, not pain.    Patient Stated Goals The patient has a child due in June and he wants to be able to carry a baby without shoulder pain.    Currently in Pain? Yes    Pain Score 4    "muscle soreness"   Pain Location Shoulder    Pain Orientation Left    Pain Descriptors / Indicators Sore    Pain Type Chronic pain    Pain Onset More than a month ago    Pain Frequency Intermittent    Aggravating Factors  neck and top of collar bone still hurt intermittently    Pain Relieving Factors rest              [facility] PT Assessment - 02/07/21 0806      Assessment   Medical Diagnosis chronic L shoulder pain    Referring Provider (PT) Lavada Mesi, MD    Onset Date/Surgical Date 01/18/21                         [facility] Adult PT Treatment/Exercise - 02/07/21 0806      Exercises   Exercises Neck;Shoulder      Neck  Exercises: Standing   Other Standing Exercises scapular depression with bilateral UEs and green band x 12 reps      Shoulder Exercises: Prone   Retraction Strengthening;Both;10 reps    Extension Strengthening;Both;12 reps    Extension Limitations adding scapular retraction + extension    Horizontal ABduction 1 Strengthening;Both;10 reps    Other Prone Exercises prone thoracic rotation x 10 reps R and L (on elbows, contralateral UE behind head)      Manual Therapy   Manual Therapy Soft tissue mobilization;Joint mobilization    Manual therapy comments skilled palpation and assessment of response to STM and DN    Joint Mobilization Grade II-III CPA upper thoracic, low cervical mobs, AP AC joint and clavicle depression    Soft tissue mobilization STM L scalenes, upper trap, SCM, leavor            Trigger Point Dry Needling - 02/07/21 0809    Consent Given? Yes    Education Handout Provided Previously provided    Muscles Treated  Head and Neck Upper trapezius;Levator scapulae;Splenius capitus;Cervical multifidi    Dry Needling Comments left side    Upper Trapezius Response Twitch reponse elicited;Palpable increased muscle length    Levator Scapulae Response Palpable increased muscle length    Splenius capitus Response Twitch reponse elicited;Palpable increased muscle length    Cervical multifidi Response Palpable increased muscle length                PT Education - 02/07/21 0838    Education Details modified HEP    Person(s) Educated Patient    Methods Explanation;Demonstration;Handout    Comprehension Verbalized understanding;Returned demonstration               PT Long Term Goals - 01/24/21 0944      PT LONG TERM GOAL #1   Title The patient will be indep with HEP for stretching and strengthening.    Time 6    Period Weeks    Target Date 03/07/21      PT LONG TERM GOAL #2   Title The patient will report no pain with daily activities (driving, overhead lifting).     Time 6    Period Weeks    Target Date 03/07/21      PT LONG TERM GOAL #3   Title The patient will demo overhead lifting of 20 lbs without c/o L shoulder/cervical pain.    Time 6    Period Weeks    Target Date 03/07/21      PT LONG TERM GOAL #4   Title The patient will improve functional status score from 70% up to 80% to demo reduced perception of pain.    Time 6    Period Weeks    Target Date 03/07/21                 Plan - 02/07/21 1142    Clinical Impression Statement The patient is continuing to make improvements with therapy.  He is sore today from work around his house over the weekend, however, he does note that he was not able to complete these tasks without pain weeks ago.  PT modified HEP adding more scapular strengthening.    Examination-Participation Restrictions Driving;Yard Work    Stability/Clinical Decision Making Stable/Uncomplicated    Rehab Potential Good    PT Frequency 2x / week    PT Duration 6 weeks    PT Treatment/Interventions ADLs/Self Care Home Management;Dry needling;Manual techniques;Therapeutic activities;Therapeutic exercise;Patient/family education;Taping;Cryotherapy;Moist Heat;Electrical Stimulation;Iontophoresis 4mg /ml Dexamethasone;Passive range of motion    PT Next Visit Plan assess soft tissue response to DN, progress scapular depression/retraction strengthening, STM/DN for parascapular musculature and trap/levator, further develop HEP    PT Home Exercise Plan Access Code: [email or web address]    Consulted and Agree with Plan of Care Patient           Patient will benefit from skilled therapeutic intervention in order to improve the following deficits and impairments:  Pain,Impaired flexibility,Hypomobility,Postural dysfunction  Visit Diagnosis: Chronic left shoulder pain  Other symptoms and signs involving the musculoskeletal system     Problem List Patient Active Problem List   Diagnosis Date Noted  . Low testosterone in male  07/01/2020  . Mixed hyperlipidemia 06/13/2020  . Anxiety state 04/21/2016  . ADHD, predominantly inattentive type 01/25/2016  . Tobacco use disorder 01/25/2016    Angy Swearengin, PT 02/07/2021, 12:47 PM  [facility] [address] [place], [place], [place] Phone: [phone]   Fax:  [phone]  Name: Danny Indelicato  Edwards MRN: [id number] Date of Birth: Aug 30, 1986

## 2021-02-08 ENCOUNTER — Other Ambulatory Visit: Payer: Self-pay | Admitting: Family Medicine

## 2021-02-08 DIAGNOSIS — F900 Attention-deficit hyperactivity disorder, predominantly inattentive type: Secondary | ICD-10-CM

## 2021-02-08 MED ORDER — AMPHETAMINE-DEXTROAMPHET ER 30 MG PO CP24: 30.0000 mg | ORAL_CAPSULE | Freq: Every morning | ORAL | 0 refills | Status: DC

## 2021-02-08 MED ORDER — AMPHETAMINE-DEXTROAMPHETAMINE 30 MG PO TABS: 30.0000 mg | ORAL_TABLET | Freq: Every day | ORAL | 0 refills | Status: DC

## 2021-02-08 MED FILL — AMPHETAMINE-DEXTROAMPHETAMI: 30 | 30 days supply | Qty: 30 | Fill #0

## 2021-02-08 MED FILL — ADDERALL XR 30 MG CAP SA: 30 | 30 days supply | Qty: 30 | Fill #0

## 2021-02-10 ENCOUNTER — Encounter: Payer: 59 | Admitting: Rehabilitative and Restorative Service Providers"

## 2021-02-13 DIAGNOSIS — F1120 Opioid dependence, uncomplicated: Secondary | ICD-10-CM | POA: Diagnosis not present

## 2021-02-14 ENCOUNTER — Other Ambulatory Visit: Payer: Self-pay

## 2021-02-14 ENCOUNTER — Encounter: Payer: Self-pay | Admitting: Physical Therapy

## 2021-02-14 ENCOUNTER — Ambulatory Visit: Payer: 59 | Admitting: Physical Therapy

## 2021-02-14 DIAGNOSIS — G8929 Other chronic pain: Secondary | ICD-10-CM

## 2021-02-14 DIAGNOSIS — R29898 Other symptoms and signs involving the musculoskeletal system: Secondary | ICD-10-CM | POA: Diagnosis not present

## 2021-02-14 DIAGNOSIS — M25512 Pain in left shoulder: Secondary | ICD-10-CM

## 2021-02-14 NOTE — Therapy (Signed)
[facility] [place] [address] [place], [place], [address] Phone: [phone]   Fax:  [phone]  Physical Therapy Treatment  Patient Details  Name: Danny Edwards MRN: [id number] Date of Birth: 03-29-1986 Referring Provider (PT): Danny Mesi, MD   Encounter Date: 02/14/2021   PT End of Session - 02/14/21 0803    Visit Number 5    Number of Visits 12    Date for PT Re-Evaluation 03/07/21    Authorization Type [place] choice plan    PT Start Time 0803    PT Stop Time 0843    PT Time Calculation (min) 40 min    Activity Tolerance Patient tolerated treatment well    Behavior During Therapy [facility] for tasks assessed/performed           Past Medical History:  Diagnosis Date  . ADHD     Past Surgical History:  Procedure Laterality Date  . KNEE SURGERY Left 2008   ACL tear    There were no vitals filed for this visit.   Subjective Assessment - 02/14/21 0804    Subjective Pt reports his Lt A/C joint is sore; "Unsure if I slept wrong".  He reports he was at [facility] and reached overhead for rebar (not heavy) and this is still painful (1-2/10).  He reports 50% improvement in symptoms since starting therapy.    Patient Stated Goals The patient has a child due in June and he wants to be able to carry a baby without shoulder pain.    Currently in Pain? Yes    Pain Score 2     Pain Location Shoulder    Pain Orientation Left;Anterior    Pain Descriptors / Indicators Sore    Pain Onset More than a month ago    Aggravating Factors  sleeping on Lt shoulder    Pain Relieving Factors stretches.              [facility] PT Assessment - 02/14/21 0001      Assessment   Medical Diagnosis chronic L shoulder pain    Referring Provider (PT) Danny Mesi, MD    Onset Date/Surgical Date 01/18/21    Hand Dominance Right    Next MD Visit PRN    Prior Therapy none              [facility] Adult PT Treatment/Exercise - 02/14/21 0001       Shoulder Exercises: Supine   Other Supine Exercises hooklying on full foam roller:  prolonged stretch with arms abdct 90 deg, then 5 active snow angels to tolerance.  Then 10 reps of D2 flexion with green band LUE, 10 reps of bilat ER with green band    Other Supine Exercises thoracic ext over full foam roller x 10 sec x 2 reps      Shoulder Exercises: Prone   External Rotation Strengthening;Both;10 reps    External Rotation Weight (lbs) 1   goal post position   Horizontal ABduction 1 Strengthening;Both;10 reps   3 sec pause   Horizontal ABduction 1 Weight (lbs) 1    Other Prone Exercises prone thoracic rotation x 5 reps R and L (on elbows, contralateral UE behind head)    Other Prone Exercises quaduped childs pose, rolling arms into bilat flexion x 5 reps      Shoulder Exercises: Standing   Other Standing Exercises overhead press with 7#, x 5 each      Shoulder Exercises: ROM/Strengthening   UBE (Upper  Arm Bike) L4: 2 min forward, 1 min backward, standing.      Manual Therapy   Manual therapy comments I strip of reg [place] tape applied to [facility] joint with 20% stretch to decompress tissue and increase proprioception.   pt instructed in safe removal.   Soft tissue mobilization IASTM to Lt pec major, ant deltoid, bicep brachii, upper trap to decrease fascial restrictions and improve mobility.                       PT Long Term Goals - 01/24/21 0944      PT LONG TERM GOAL #1   Title The patient will be indep with HEP for stretching and strengthening.    Time 6    Period Weeks    Target Date 03/07/21      PT LONG TERM GOAL #2   Title The patient will report no pain with daily activities (driving, overhead lifting).    Time 6    Period Weeks    Target Date 03/07/21      PT LONG TERM GOAL #3   Title The patient will demo overhead lifting of 20 lbs without c/o L shoulder/cervical pain.    Time 6    Period Weeks    Target Date 03/07/21      PT LONG TERM GOAL #4    Title The patient will improve functional status score from 70% up to 80% to demo reduced perception of pain.    Time 6    Period Weeks    Target Date 03/07/21                 Plan - 02/14/21 0805    Clinical Impression Statement Good improvement in symptoms since initiating therapy.  He has some discomfort with overhead press with wt, but improving.  After IASTM to Lt ant chest/shoulder and tape application to Lt AC joint, pt able to complete overpress motion without pain.  Increased palpable tightness in Lt pec major at sternum, subclavius, and bicep brachii tendon (prox).  Progressing well towards goals.    Examination-Participation Restrictions Driving;Yard Work    Stability/Clinical Decision Making Stable/Uncomplicated    Rehab Potential Good    PT Frequency 2x / week    PT Duration 6 weeks    PT Treatment/Interventions ADLs/Self Care Home Management;Dry needling;Manual techniques;Therapeutic activities;Therapeutic exercise;Patient/family education;Taping;Cryotherapy;Moist Heat;Electrical Stimulation;Iontophoresis 4mg /ml Dexamethasone;Passive range of motion    PT Next Visit Plan continue IASTM/ DN, progress scapular depression/retraction strengthening,    PT Home Exercise Plan Access Code: [email or web address]    Consulted and Agree with Plan of Care Patient           Patient will benefit from skilled therapeutic intervention in order to improve the following deficits and impairments:  Pain,Impaired flexibility,Hypomobility,Postural dysfunction  Visit Diagnosis: Chronic left shoulder pain  Other symptoms and signs involving the musculoskeletal system     Problem List Patient Active Problem List   Diagnosis Date Noted  . Low testosterone in male 07/01/2020  . Mixed hyperlipidemia 06/13/2020  . Anxiety state 04/21/2016  . ADHD, predominantly inattentive type 01/25/2016  . Tobacco use disorder 01/25/2016   03/24/2016, PTA 02/14/21 8:51 AM  [facility] [address] [place], [place], [place] Phone: [phone]   Fax:  [phone]  Name: Danny Edwards MRN: [name] Date of Birth: 01/01/1986

## 2021-02-15 NOTE — Progress Notes (Signed)
   Subjective:   Patient ID: Danny Edwards    DOB: [birth year], 35 y.o. male   MRN: [id number]  Danny Edwards is a 35 y.o. male with a history of ADHD, low testosterone, HLD, tobacco use here for left ear pain.  Left ear pain: Patient presents with 1 month history of left ear pain that self resolved 2 days ago. He notes the pain is worse in the morning. Denies any bleeding or drainage but does endorse some crusting along the edges of his ear. He also notes some discomfort on his left side of his throat, feels like "a spot that burns when salt or sour food touches it". He notes a film along his tongue which she has been using a tongue scraper and Listerine. The film comes off when he uses the tongue depressor. Denies any fevers, chills, cough, congestion , sinus pain, teeth grinding, mouth ulcers, teeth pain. He treated the ear pain with Debrox but he feels like it made it worse. Denies hearing loss or dizziness.  ADHD: Endorses compliance with Adderall XR 30 mg daily plus Adderall IR 30 minutes daily. Denies any unexplained weight loss or difficulty sleeping. Denies any unwanted side effects. He has been trying to lose weight using the [employer] app. Denies any concerns or complaints.  Review of Systems:  Per HPI.   Objective:   BP 100/70   Pulse 75   Ht 5\' 9"  (1.753 m)   Wt 216 lb 12.8 oz (98.3 kg)   SpO2 99%   BMI 32.02 kg/m  Vitals and nursing note reviewed.  General: pleasant middle-age man, sitting comfortably in exam chair, well nourished, well developed, in no acute distress with non-toxic appearance HEENT: normocephalic, atraumatic, moist mucous membranes, oropharynx clear without erythema or exudate, no mouth ulcers, erythema, signs of infection, TM normal bilaterally, some dry earwax along outer edges of canal bilaterally otherwise ear canal, eardrum, middle ear appears normal Neck: supple, non-tender without lymphadenopathy Resp: breathing comfortably on room air, speaking in full  sentences Skin: warm, dry MSK: gait normal Neuro: Alert and oriented, speech normal  Assessment & Plan:   ADHD, predominantly inattentive type Chronic, well controlled. Denies any side effects with current medications. States monthly drug test at methadone clinic. -Continue Adderall XR 30 mg daily plus Adderall IR 30 mg daily -Follow-up 3 months  Anxiety state Chronic, well controlled with hydroxyzine as needed. -Continue current medications -Refill provided.  Left ear pain Acute. Endorsed 1 month history of left ear pain without other infectious symptoms. Has since resolved resolved. No acute findings on physical exam. Provided reassurance. Recommend follow-up if recurs. Patient voiced understanding agreement plan.  No orders of the defined types were placed in this encounter.  Meds ordered this encounter  Medications  . hydrOXYzine (ATARAX/VISTARIL) 50 MG tablet    Sig: Take 1-2 tablets (50-100 mg total) by mouth 3 (three) times daily as needed for anxiety (or insomnia).    Dispense:  90 tablet    Refill:  1    , DO PGY-3, [facility] 02/16/2021 9:24 AM

## 2021-02-16 ENCOUNTER — Ambulatory Visit: Payer: 59 | Admitting: Family Medicine

## 2021-02-16 ENCOUNTER — Other Ambulatory Visit: Payer: Self-pay | Admitting: Family Medicine

## 2021-02-16 ENCOUNTER — Other Ambulatory Visit: Payer: Self-pay

## 2021-02-16 ENCOUNTER — Encounter: Payer: 59 | Admitting: Physical Therapy

## 2021-02-16 VITALS — BP 100/70 | HR 75 | Ht 69.0 in | Wt 216.8 lb

## 2021-02-16 DIAGNOSIS — F900 Attention-deficit hyperactivity disorder, predominantly inattentive type: Secondary | ICD-10-CM | POA: Diagnosis not present

## 2021-02-16 DIAGNOSIS — F411 Generalized anxiety disorder: Secondary | ICD-10-CM | POA: Diagnosis not present

## 2021-02-16 DIAGNOSIS — H9202 Otalgia, left ear: Secondary | ICD-10-CM | POA: Diagnosis not present

## 2021-02-16 MED ORDER — HYDROXYZINE HCL 50 MG PO TABS: 50.0000 mg | ORAL_TABLET | Freq: Three times a day (TID) | ORAL | 1 refills | Status: DC | PRN

## 2021-02-16 MED FILL — hydrOXYzine HCL 50 MG TABS: 50 | 30 days supply | Qty: 90 | Fill #0

## 2021-02-16 NOTE — Assessment & Plan Note (Signed)
Chronic, well controlled with hydroxyzine as needed. -Continue current medications -Refill provided.

## 2021-02-16 NOTE — Patient Instructions (Signed)
I did not find any abnormalities on physical exam to explain your left ear pain. I am very happy that the pain has resolved. Please follow-up if it recurs so we can reevaluate. I have called in prescription of hydroxyzine for anxiety and sleep. Please follow-up for your Covid booster vaccine at your earliest convenience.

## 2021-02-16 NOTE — Assessment & Plan Note (Signed)
Acute. Endorsed 1 month history of left ear pain without other infectious symptoms. Has since resolved resolved. No acute findings on physical exam. Provided reassurance. Recommend follow-up if recurs. Patient voiced understanding agreement plan.

## 2021-02-16 NOTE — Assessment & Plan Note (Signed)
Chronic, well controlled. Denies any side effects with current medications. States monthly drug test at methadone clinic. -Continue Adderall XR 30 mg daily plus Adderall IR 30 mg daily -Follow-up 3 months

## 2021-02-18 ENCOUNTER — Other Ambulatory Visit: Payer: Self-pay

## 2021-02-18 ENCOUNTER — Encounter: Payer: Self-pay | Admitting: Rehabilitative and Restorative Service Providers"

## 2021-02-18 ENCOUNTER — Ambulatory Visit: Payer: 59 | Admitting: Rehabilitative and Restorative Service Providers"

## 2021-02-18 DIAGNOSIS — R29898 Other symptoms and signs involving the musculoskeletal system: Secondary | ICD-10-CM

## 2021-02-18 DIAGNOSIS — G8929 Other chronic pain: Secondary | ICD-10-CM | POA: Diagnosis not present

## 2021-02-18 DIAGNOSIS — M25512 Pain in left shoulder: Secondary | ICD-10-CM | POA: Diagnosis not present

## 2021-02-18 NOTE — Patient Instructions (Signed)
T at wall stretching into Lt horizontal abduction shoulder @ 90 deg 20-30 sec x 3   Higher position for doorway stretch to stretch pec minor 20-30 sec x 3

## 2021-02-18 NOTE — Therapy (Signed)
[facility] [place] [address] [place], [place], [address] Phone: [phone]   Fax:  [phone]  Physical Therapy Treatment  Patient Details  Name: Danny Edwards MRN: [id number] Date of Birth: 08/01/1986 Referring Provider (PT): Lavada Mesi, MD   Encounter Date: 02/18/2021   PT End of Session - 02/18/21 1020    Visit Number 6    Number of Visits 12    Date for PT Re-Evaluation 03/07/21    Authorization Type [place] choice plan    PT Start Time 1020    PT Stop Time 1108    PT Time Calculation (min) 48 min    Activity Tolerance Patient tolerated treatment well           Past Medical History:  Diagnosis Date  . ADHD     Past Surgical History:  Procedure Laterality Date  . KNEE SURGERY Left 2008   ACL tear    There were no vitals filed for this visit.   Subjective Assessment - 02/18/21 1021    Subjective Patient reports that he has incresaed soreness and tightness in the Lt shoulder and neck area today - may have slept wrong. He has had an earache for the past few days but the MD coyuld not find anything wrong with the ear of other infectiion etc. Patient can feel a pullin the Lt cheek toward the ear when turning head to the Rt    Currently in Pain? Yes    Pain Score 4     Pain Location Shoulder   top of Lt shoulder   Pain Orientation Left    Pain Descriptors / Indicators Sore    Pain Type Chronic pain    Pain Onset More than a month ago    Pain Frequency Intermittent              [facility] PT Assessment - 02/18/21 0001      Assessment   Medical Diagnosis chronic L shoulder pain    Referring Provider (PT) Lavada Mesi, MD    Onset Date/Surgical Date 01/18/21    Hand Dominance Right    Next MD Visit PRN    Prior Therapy therapy for knee      Palpation   Palpation comment muscular tightness Lt upper quarter through the SCM; scaleni; pecs; upper trap; leveator;                          [facility] Adult PT Treatment/Exercise - 02/18/21 0001      Shoulder Exercises: ROM/Strengthening   UBE (Upper Arm Bike) L4: 2 min forward, 2 min backward, standing.      Shoulder Exercises: Stretch   Wall Stretch - ABduction 3 reps;30 seconds   Lt T at wall stretching into horizontal abduction to stretch pecs tipping head to Rt     Moist Heat Therapy   Number Minutes Moist Heat 10 Minutes    Moist Heat Location Cervical;Shoulder      Manual Therapy   Manual therapy comments skilled palpation to assess response to DN/manual work    Soft tissue mobilization deep tissue work through [employer] upper quarter into Lt SCM; scaleni; pecs      Neck Exercises: Stretches   Other Neck Stretches 3 position doorway stretch x 20 sec x 2 reps each.  bilat bicep stretch holding door frame with straight elbows, 20 sec x 2    Other Neck Stretches added higher position for doorway stretch  for pec minor 30 sec x 2 reps            Trigger Point Dry Needling - 02/18/21 0001    Consent Given? Yes    Education Handout Provided Previously provided    Dry Needling Comments left side    Sternocleidomastoid Response Palpable increased muscle length;Twitch response elicited    Scalenes Response Palpable increased muscle length;Twitch reponse elicited    Pectoralis Major Response Palpable increased muscle length;Twitch response elicited    Pectoralis Minor Response Palpable increased muscle length;Twitch response elicited                PT Education - 02/18/21 1109    Education Details HEP    Person(s) Educated Patient    Methods Explanation;Demonstration;Tactile cues;Verbal cues    Comprehension Verbalized understanding;Returned demonstration;Verbal cues required;Tactile cues required               PT Long Term Goals - 01/24/21 0944      PT LONG TERM GOAL #1   Title The patient will be indep with HEP for stretching and strengthening.    Time 6    Period Weeks     Target Date 03/07/21      PT LONG TERM GOAL #2   Title The patient will report no pain with daily activities (driving, overhead lifting).    Time 6    Period Weeks    Target Date 03/07/21      PT LONG TERM GOAL #3   Title The patient will demo overhead lifting of 20 lbs without c/o L shoulder/cervical pain.    Time 6    Period Weeks    Target Date 03/07/21      PT LONG TERM GOAL #4   Title The patient will improve functional status score from 70% up to 80% to demo reduced perception of pain.    Time 6    Period Weeks    Target Date 03/07/21                 Plan - 02/18/21 1113    Clinical Impression Statement Increase in symptoms today perhaps from increased activity yesterday and sleeping on the Lt side. Notices pain in the Lt ear. He did see him MD and she does not see any problems with the ear. patient has significant tightness in the Lt ant/lat/posterior cervical musculature into the pecs and upper traps/leveator. Responded well to DN and manual work followed by stretching. He did report some soreness in the pec musculature following treatment. Advised to use heat and continue with stretching to tolerance. Will benefit from continued DN to address tightness in the anterior cervical and pec musculature.    Rehab Potential Good    PT Frequency 2x / week    PT Duration 6 weeks    PT Treatment/Interventions ADLs/Self Care Home Management;Dry needling;Manual techniques;Therapeutic activities;Therapeutic exercise;Patient/family education;Taping;Cryotherapy;Moist Heat;Electrical Stimulation;Iontophoresis 4mg /ml Dexamethasone;Passive range of motion    PT Next Visit Plan assess response to DN/manual work and stretching through the cervical and pec musculature; continue IASTM/ DN, progress scapular depression/retraction strengthening,    PT Home Exercise Plan [email or web address]    Consulted and Agree with Plan of Care Patient           Patient will benefit from skilled therapeutic  intervention in order to improve the following deficits and impairments:     Visit Diagnosis: Chronic left shoulder pain  Other symptoms and signs involving the musculoskeletal system     Problem  List Patient Active Problem List   Diagnosis Date Noted  . Left ear pain 02/16/2021  . Low testosterone in male 07/01/2020  . Mixed hyperlipidemia 06/13/2020  . Anxiety state 04/21/2016  . ADHD, predominantly inattentive type 01/25/2016  . Tobacco use disorder 01/25/2016    Aditri Louischarles Rober Minion PT, MPH  02/18/2021, 11:19 AM  [facility] [address] [place], [place], [address] Phone: [phone]   Fax:  [phone]  Name: Danny Edwards MRN: [id number] Date of Birth: Mar 02, 1986

## 2021-02-20 DIAGNOSIS — F1120 Opioid dependence, uncomplicated: Secondary | ICD-10-CM | POA: Diagnosis not present

## 2021-02-21 ENCOUNTER — Encounter: Payer: 59 | Admitting: Physical Therapy

## 2021-02-23 ENCOUNTER — Encounter: Payer: 59 | Admitting: Rehabilitative and Restorative Service Providers"

## 2021-02-24 DIAGNOSIS — F1120 Opioid dependence, uncomplicated: Secondary | ICD-10-CM | POA: Diagnosis not present

## 2021-02-27 DIAGNOSIS — F1120 Opioid dependence, uncomplicated: Secondary | ICD-10-CM | POA: Diagnosis not present

## 2021-02-28 ENCOUNTER — Other Ambulatory Visit: Payer: Self-pay

## 2021-02-28 ENCOUNTER — Ambulatory Visit: Payer: 59 | Admitting: Physical Therapy

## 2021-02-28 ENCOUNTER — Encounter: Payer: Self-pay | Admitting: Physical Therapy

## 2021-02-28 DIAGNOSIS — M25512 Pain in left shoulder: Secondary | ICD-10-CM | POA: Diagnosis not present

## 2021-02-28 DIAGNOSIS — R29898 Other symptoms and signs involving the musculoskeletal system: Secondary | ICD-10-CM | POA: Diagnosis not present

## 2021-02-28 DIAGNOSIS — G8929 Other chronic pain: Secondary | ICD-10-CM | POA: Diagnosis not present

## 2021-02-28 NOTE — Therapy (Signed)
[place], [place], [address] Phone: [phone]   Fax:  [phone]  Physical Therapy Treatment  Patient Details  Name: Danny Edwards MRN: [id number] Date of Birth: 1986/12/10 Referring Provider (PT): Eunice Blase, MD   Encounter Date: 02/28/2021   PT End of Session - 02/28/21 0805    Visit Number 7    Number of Visits 12    Date for PT Re-Evaluation 03/07/21    Authorization Type [place] choice plan    PT Start Time 0804    PT Stop Time 0842    PT Time Calculation (min) 38 min    Activity Tolerance Patient tolerated treatment well    Behavior During Therapy [facility] for tasks assessed/performed           Past Medical History:  Diagnosis Date  . ADHD     Past Surgical History:  Procedure Laterality Date  . KNEE SURGERY Left 2008   ACL tear    There were no vitals filed for this visit.   Subjective Assessment - 02/28/21 0807    Subjective Pt reports he was sore after DN, but his Lt ear is no longer sore.  He is curious if he should get a brace to keep shoulder back.  He reports that the high level doorway stretch bothers his Lt shoulder "it's just uncomfortable".    Currently in Pain? No/denies    Pain Score 0-No pain    Pain Onset More than a month ago              [facility] PT Assessment - 02/28/21 0001      Assessment   Medical Diagnosis chronic L shoulder pain    Referring Provider (PT) Eunice Blase, MD    Onset Date/Surgical Date 01/18/21    Hand Dominance Right    Next MD Visit PRN    Prior Therapy therapy for knee            [facility] Adult PT Treatment/Exercise - 02/28/21 0001      Neck Exercises: Sidelying   Other Sidelying Exercise Open book Lt rotation, green band x 5 sec hold x 10 reps      Lumbar Exercises: Quadruped   Other Quadruped Lumbar Exercises reaching arm towards ceiling for thoracic rotation x 5 reps each arm.      Shoulder Exercises: Supine    Other Supine Exercises hooklying on pool noodle:  prolonged stretch with arms abdct 90 deg.  Then 12 reps of D2 flexion with green band LUE, 12 reps of bilat ER with green band    Other Supine Exercises thoracic ext over pool noodle x 10 sec x 2 reps      Shoulder Exercises: Prone   Other Prone Exercises prone thoracic rotation x 1rep R and L (on elbows, contralateral UE behind head)- switched to quadruped      Shoulder Exercises: Standing   Row Strengthening;Both;10 reps;Theraband   3 sec pause in retraction   Theraband Level (Shoulder Row) Level 4 (Blue)    Other Standing Exercises overhead press with 7#, x 10 each arm (mirror for feedback on form)   Other Standing Exercises 20# box lift waist to eye level shelf x 2 reps      Shoulder Exercises: ROM/Strengthening   UBE (Upper Arm Bike) L4: 2 min forward, 1.5 min backward, standing.      Shoulder Exercises: Stretch   Wall Stretch - ABduction 30 seconds;2 reps   Lt  T at wall stretching into horizontal abduction to stretch pecs tipping head to Rt   Other Shoulder Stretches tricep stretch (LUE) with wal assist x 30 sec    Other Shoulder Stretches midlevel doorway stretch x 20 sec x 2 reps, Lt high level doorway stretch (no pain) x 20 sec                       PT Long Term Goals - 02/28/21 0810      PT LONG TERM GOAL #1   Title The patient will be indep with HEP for stretching and strengthening.    Time 6    Period Weeks    Status Partially Met      PT LONG TERM GOAL #2   Title The patient will report no pain with daily activities (driving, overhead lifting).    Time 6    Period Weeks    Status Partially Met      PT LONG TERM GOAL #3   Title The patient will demo overhead lifting of 20 lbs without c/o L shoulder/cervical pain.    Baseline able to lift 20# box overhead (with both hands) without pain -02/28/21    Time 6    Period Weeks    Status Achieved      PT LONG TERM GOAL #4   Title The patient will improve  functional status score from 70% up to 80% to demo reduced perception of pain.    Time 6    Period Weeks    Status On-going                 Plan - 02/28/21 0847    Clinical Impression Statement Pt tolerated all stretches and strengthening exercises well without any production of pain, just fatigue in area of upper trap and infraspinatus on Lt. No uncomfortable feeling with high level doorway stretch during session (performed unilateral). Pt demonstrated ability to lift 20# box to shelf overhead 2x without any production of symptoms. Pt voiced interest in DN next visit and decreasing freq next wk.  Pt has partially met his goals and is making great progress towards remaining goals.    Rehab Potential Good    PT Frequency 2x / week    PT Duration 6 weeks    PT Treatment/Interventions ADLs/Self Care Home Management;Dry needling;Manual techniques;Therapeutic activities;Therapeutic exercise;Patient/family education;Taping;Cryotherapy;Moist Heat;Electrical Stimulation;Iontophoresis 14m/ml Dexamethasone;Passive range of motion    PT Next Visit Plan Manual/DN through the cervical and pec musculature;  progress scapular depression/retraction strengthening.    PT Home Exercise Plan [email or web address]    Consulted and Agree with Plan of Care Patient           Patient will benefit from skilled therapeutic intervention in order to improve the following deficits and impairments:  Pain,Impaired flexibility,Hypomobility,Postural dysfunction  Visit Diagnosis: Chronic left shoulder pain  Other symptoms and signs involving the musculoskeletal system     Problem List Patient Active Problem List   Diagnosis Date Noted  . Left ear pain 02/16/2021  . Low testosterone in male 07/01/2020  . Mixed hyperlipidemia 06/13/2020  . Anxiety state 04/21/2016  . ADHD, predominantly inattentive type 01/25/2016  . Tobacco use disorder 01/25/2016   JKerin Perna PTA 02/28/21 8:55 AM  [facility] [address]Phone: [phone]  Fax:  [phone] Name: Danny SPRAGGMRN: [id number] of Birth: 107-27-1987

## 2021-03-02 ENCOUNTER — Encounter: Payer: Self-pay | Admitting: Rehabilitative and Restorative Service Providers"

## 2021-03-02 ENCOUNTER — Ambulatory Visit: Payer: 59 | Admitting: Rehabilitative and Restorative Service Providers"

## 2021-03-02 ENCOUNTER — Other Ambulatory Visit: Payer: Self-pay

## 2021-03-02 DIAGNOSIS — M25512 Pain in left shoulder: Secondary | ICD-10-CM | POA: Diagnosis not present

## 2021-03-02 DIAGNOSIS — G8929 Other chronic pain: Secondary | ICD-10-CM | POA: Diagnosis not present

## 2021-03-02 DIAGNOSIS — R29898 Other symptoms and signs involving the musculoskeletal system: Secondary | ICD-10-CM | POA: Diagnosis not present

## 2021-03-02 NOTE — Therapy (Signed)
[place], [place], [address] Phone: [phone]   Fax:  [phone]  Physical Therapy Treatment  Patient Details  Name: Danny Edwards MRN: [id number] Date of Birth: [birth year] Referring Provider (PT): Eunice Blase, MD   Encounter Date: 03/02/2021   PT End of Session - 03/02/21 0806    Visit Number 8    Number of Visits 12    Date for PT Re-Evaluation 03/07/21    PT Start Time 0804    PT Stop Time 0845   MH end of treatment   PT Time Calculation (min) 41 min    Activity Tolerance Patient tolerated treatment well           Past Medical History:  Diagnosis Date  . ADHD     Past Surgical History:  Procedure Laterality Date  . KNEE SURGERY Left 2008   ACL tear    There were no vitals filed for this visit.   Subjective Assessment - 03/02/21 0806    Subjective Patient reports that he is improving. He no pain. Did have some spasm in the left shoulder last night while sleeping    Currently in Pain? No/denies    Pain Score 0-No pain                             [facility] Adult PT Treatment/Exercise - 03/02/21 0001      Shoulder Exercises: ROM/Strengthening   UBE (Upper Arm Bike) L4: 2 min forward, 2 min backward, standing.      Shoulder Exercises: Stretch   Wall Stretch - ABduction 30 seconds;2 reps   Lt T at wall stretching into horizontal abduction to stretch pecs tipping head to Rt   Other Shoulder Stretches tricep stretch (LUE) at wall x 30 sec    Other Shoulder Stretches 3 position doorway stretch 20 sec hold x 2 reps each position      Moist Heat Therapy   Number Minutes Moist Heat 10 Minutes    Moist Heat Location Cervical;Shoulder      Manual Therapy   Manual therapy comments skilled palpation to assess response to DN/manual work    Joint Mobilization cervical PA mobs    Soft tissue mobilization deep tissue work through [employer] upper quarter into Lt SCM; scaleni;  Development worker, international aid Dry Needling - 03/02/21 0001    Consent Given? Yes    Education Handout Provided Previously provided    Dry Needling Comments left side    Sternocleidomastoid Response Palpable increased muscle length;Twitch response elicited    Scalenes Response Palpable increased muscle length;Twitch reponse elicited    Cervical multifidi Response Palpable increased muscle length;Twitch reponse elicited                     PT Long Term Goals - 02/28/21 0810      PT LONG TERM GOAL #1   Title The patient will be indep with HEP for stretching and strengthening.    Time 6    Period Weeks    Status Partially Met      PT LONG TERM GOAL #2   Title The patient will report no pain with daily activities (driving, overhead lifting).    Time 6    Period Weeks    Status Partially Met      PT LONG TERM GOAL #3  Title The patient will demo overhead lifting of 20 lbs without c/o L shoulder/cervical pain.    Baseline able to lift 20# box overhead (with both hands) without pain -02/28/21    Time 6    Period Weeks    Status Achieved      PT LONG TERM GOAL #4   Title The patient will improve functional status score from 70% up to 80% to demo reduced perception of pain.    Time 6    Period Weeks    Status On-going                 Plan - 03/02/21 4492    Clinical Impression Statement Continued improvement. DN helps to decrease palpable tightness in the cervical spine and shoulder area. Continues to progress toward stated goals of therapy    Rehab Potential Good    PT Frequency 2x / week    PT Duration 6 weeks    PT Treatment/Interventions ADLs/Self Care Home Management;Dry needling;Manual techniques;Therapeutic activities;Therapeutic exercise;Patient/family education;Taping;Cryotherapy;Moist Heat;Electrical Stimulation;Iontophoresis 43m/ml Dexamethasone;Passive range of motion    PT Next Visit Plan Manual/DN through the cervical and pec musculature;   progress scapular depression/retraction strengthening.    PT Home Exercise Plan [email or web address]    Consulted and Agree with Plan of Care Patient           Patient will benefit from skilled therapeutic intervention in order to improve the following deficits and impairments:     Visit Diagnosis: Chronic left shoulder pain  Other symptoms and signs involving the musculoskeletal system     Problem List Patient Active Problem List   Diagnosis Date Noted  . Left ear pain 02/16/2021  . Low testosterone in male 07/01/2020  . Mixed hyperlipidemia 06/13/2020  . Anxiety state 04/21/2016  . ADHD, predominantly inattentive type 01/25/2016  . Tobacco use disorder 01/25/2016    Genny Caulder PNilda SimmerPT, MPH  03/02/2021, 8:35 AM  [facility] [address]Phone: [phone]  Fax:  [phone] Name: Danny EUGENEMRN: [id number] of Birth: [birth year]

## 2021-03-06 DIAGNOSIS — F1120 Opioid dependence, uncomplicated: Secondary | ICD-10-CM | POA: Diagnosis not present

## 2021-03-07 ENCOUNTER — Encounter: Payer: Self-pay | Admitting: Physical Therapy

## 2021-03-07 ENCOUNTER — Ambulatory Visit: Payer: 59 | Admitting: Physical Therapy

## 2021-03-07 ENCOUNTER — Other Ambulatory Visit: Payer: Self-pay

## 2021-03-07 DIAGNOSIS — G8929 Other chronic pain: Secondary | ICD-10-CM

## 2021-03-07 DIAGNOSIS — M25512 Pain in left shoulder: Secondary | ICD-10-CM | POA: Diagnosis not present

## 2021-03-07 DIAGNOSIS — R29898 Other symptoms and signs involving the musculoskeletal system: Secondary | ICD-10-CM

## 2021-03-07 NOTE — Therapy (Signed)
[place], [place], [address] Phone: [phone]   Fax:  [phone]  Physical Therapy Treatment  Patient Details  Name: Danny Edwards MRN: [id number] Date of Birth: [birth year] Referring Provider (PT): Eunice Blase, MD   Encounter Date: 03/07/2021   PT End of Session - 03/07/21 0854    Visit Number 9    Number of Visits 12    Date for PT Re-Evaluation 03/07/21    PT Start Time 0849    PT Stop Time 0927    PT Time Calculation (min) 38 min    Activity Tolerance Patient tolerated treatment well    Behavior During Therapy [facility] for tasks assessed/performed           Past Medical History:  Diagnosis Date  . ADHD     Past Surgical History:  Procedure Laterality Date  . KNEE SURGERY Left 2008   ACL tear    There were no vitals filed for this visit.   Subjective Assessment - 03/07/21 0855    Subjective Pt reports he carried his wife's battery by his waist (with both hands) and it bothered to the top of Lt shoulder, "it was uncomfortable".  Otherwise, he's pleased with progress.    Patient Stated Goals The patient has a child due in June and he wants to be able to carry a baby without shoulder pain.    Currently in Pain? No/denies    Pain Score 0-No pain              [facility] PT Assessment - 03/07/21 0001      Assessment   Medical Diagnosis chronic L shoulder pain    Referring Provider (PT) Eunice Blase, MD    Onset Date/Surgical Date 01/18/21    Hand Dominance Right    Next MD Visit PRN    Prior Therapy therapy for knee            [facility] Adult PT Treatment/Exercise - 03/07/21 0001      Self-Care   Other Self-Care Comments  pt instructed in self IASTM to neck, shoulder; pt verbalized understanding.      Lumbar Exercises: Quadruped   Other Quadruped Lumbar Exercises reaching arm towards ceiling for thoracic rotation x 5 reps each arm.      Shoulder Exercises: Supine   Other  Supine Exercises thoracic ext over pool noodle x 10 sec x 2 reps      Shoulder Exercises: Seated   Other Seated Exercises scap depression with hands on yoga blocks x 10.      Shoulder Exercises: Standing   External Rotation Strengthening;Both;10 reps    Theraband Level (Shoulder External Rotation) Level 4 (Blue)    Row Strengthening;Both;10 reps;Theraband   3 sec pause in retraction   Theraband Level (Shoulder Row) Level 4 (Blue)    Other Standing Exercises scap depression with blue band x 5 reps; switched to seated position.  upright row with 15# kb x 10 (mirror for visual feedback)    Other Standing Exercises verbally reviewed d2 flexion with band (suggested supine and progressing to standing).      Shoulder Exercises: ROM/Strengthening   UBE (Upper Arm Bike) L6: 2 min forward, 2 min backward, standing.      Shoulder Exercises: Stretch   Other Shoulder Stretches tricep stretch (LUE) at wall x 30 sec x 2    Other Shoulder Stretches midlevel doorway stretch x 30 sec x 2 reps; overhead door stretch x 10  sec      Manual Therapy   Soft tissue mobilization IASTM to Lt upper trap, scalenes, pec at clavical, levator, and cervical paraspinals to decrease fascial restrictions and improve ROM.      Neck Exercises: Stretches   Upper Trapezius Stretch Left;1 rep;20 seconds    Levator Stretch Left;1 rep;20 seconds             PT Long Term Goals - 02/28/21 0810      PT LONG TERM GOAL #1   Title The patient will be indep with HEP for stretching and strengthening.    Time 6    Period Weeks    Status Partially Met      PT LONG TERM GOAL #2   Title The patient will report no pain with daily activities (driving, overhead lifting).    Time 6    Period Weeks    Status Partially Met      PT LONG TERM GOAL #3   Title The patient will demo overhead lifting of 20 lbs without c/o L shoulder/cervical pain.    Baseline able to lift 20# box overhead (with both hands) without pain -02/28/21     Time 6    Period Weeks    Status Achieved      PT LONG TERM GOAL #4   Title The patient will improve functional status score from 70% up to 80% to demo reduced perception of pain.    Time 6    Period Weeks    Status On-going                 Plan - 03/07/21 0174    Clinical Impression Statement Pt tolerated all exercises well, without production of pain.  Able to tolerate increased resistance with bilat ER without difficulty.  Palpable tightness in Lt scalenes, SCM, cervical paraspinals; improved with IASTM to area.  Encouraged pt to be mindful of posture with work/driving, to avoid aggrivating factors.  Progressing well towards remaining LTG.  Pt verbalized interest to reduce freq after next visit.    Rehab Potential Good    PT Frequency 2x / week    PT Duration 6 weeks    PT Treatment/Interventions ADLs/Self Care Home Management;Dry needling;Manual techniques;Therapeutic activities;Therapeutic exercise;Patient/family education;Taping;Cryotherapy;Moist Heat;Electrical Stimulation;Iontophoresis 64m/ml Dexamethasone;Passive range of motion    PT Next Visit Plan end of POC.  assess goals.  FOTO.    PT Home Exercise Plan [email or web address]    Consulted and Agree with Plan of Care Patient           Patient will benefit from skilled therapeutic intervention in order to improve the following deficits and impairments:     Visit Diagnosis: Chronic left shoulder pain  Other symptoms and signs involving the musculoskeletal system     Problem List Patient Active Problem List   Diagnosis Date Noted  . Left ear pain 02/16/2021  . Low testosterone in male 07/01/2020  . Mixed hyperlipidemia 06/13/2020  . Anxiety state 04/21/2016  . ADHD, predominantly inattentive type 01/25/2016  . Tobacco use disorder 01/25/2016   JKerin Perna PTA 03/07/21 9:30 AM  [place] [address]Phone:  [phone]  Fax:  [phone] Name: Danny KAEDINGMRN: [id number] of Birth: [birth year]

## 2021-03-09 ENCOUNTER — Encounter: Payer: Self-pay | Admitting: Rehabilitative and Restorative Service Providers"

## 2021-03-09 ENCOUNTER — Ambulatory Visit: Payer: 59 | Admitting: Rehabilitative and Restorative Service Providers"

## 2021-03-09 ENCOUNTER — Other Ambulatory Visit: Payer: Self-pay

## 2021-03-09 DIAGNOSIS — M25512 Pain in left shoulder: Secondary | ICD-10-CM

## 2021-03-09 DIAGNOSIS — R29898 Other symptoms and signs involving the musculoskeletal system: Secondary | ICD-10-CM | POA: Diagnosis not present

## 2021-03-09 DIAGNOSIS — G8929 Other chronic pain: Secondary | ICD-10-CM | POA: Diagnosis not present

## 2021-03-09 NOTE — Therapy (Signed)
[place], [place], [address] Phone: [phone]   Fax:  [phone]  Physical Therapy Treatment  Patient Details  Name: Danny Edwards MRN: [id number] Date of Birth: 08/02/1986 Referring Provider (PT): Eunice Blase, MD   Encounter Date: 03/09/2021   PT End of Session - 03/09/21 0932    Visit Number 10    Number of Visits 22    Date for PT Re-Evaluation 04/20/21    PT Start Time 0930    PT Stop Time 1018    PT Time Calculation (min) 48 min           Past Medical History:  Diagnosis Date  . ADHD     Past Surgical History:  Procedure Laterality Date  . KNEE SURGERY Left 2008   ACL tear    There were no vitals filed for this visit.       [facility] PT Assessment - 03/09/21 0001      Assessment   Medical Diagnosis chronic L shoulder pain    Referring Provider (PT) Eunice Blase, MD    Onset Date/Surgical Date 01/18/21    Hand Dominance Right    Next MD Visit PRN    Prior Therapy therapy for knee      Observation/Other Assessments   Focus on Therapeutic Outcomes (FOTO)  70% functional status score      AROM   Overall AROM Comments tightness end range cervical mobility/ROM      Strength   Right Shoulder Flexion 5/5    Right Shoulder ABduction 5/5    Right Shoulder External Rotation 5/5    Left Shoulder Flexion 5/5    Left Shoulder ABduction 5/5    Left Shoulder External Rotation 5/5      Palpation   Spinal mobility hypomobile cervical PA mobs    Palpation comment muscular tightness Lt upper quarter through the SCM; scaleni; pecs; upper trap; leveator;                         [facility] Adult PT Treatment/Exercise - 03/09/21 0001      Therapeutic Activites    Therapeutic Activities Other Therapeutic Activities    Other Therapeutic Activities education re- work and rest positions/postures including desk/computer/recliner/bed      Shoulder Exercises:  ROM/Strengthening   UBE (Upper Arm Bike) L6: 2 min forward, 2 min backward, standing.      Shoulder Exercises: Stretch   Other Shoulder Stretches doorway stretch x 30 sec x 2 reps      Manual Therapy   Manual therapy comments skilled palpation to assess response to DN/manual work    Joint Mobilization cervical PA mobs    Soft tissue mobilization through Lt upper trap, scalenes, pec at clavical, levator, and cervical paraspinals to decrease fascial restrictions and improve ROM.      Neck Exercises: Stretches   Upper Trapezius Stretch Left;1 rep;20 seconds    Levator Stretch Left;1 rep;20 seconds            Trigger Point Dry Needling - 03/09/21 0001    Consent Given? Yes    Education Handout Provided Previously provided    Dry Needling Comments left side    Sternocleidomastoid Response Palpable increased muscle length;Twitch response elicited    Scalenes Response Palpable increased muscle length;Twitch reponse elicited    Splenius capitus Response Palpable increased muscle length    Cervical multifidi Response Palpable increased muscle length;Twitch reponse elicited  PT Long Term Goals - 03/09/21 1016      PT LONG TERM GOAL #1   Title The patient will be indep with HEP for stretching and strengthening.    Time 6    Period Weeks    Status Partially Met    Target Date 04/20/21      PT LONG TERM GOAL #2   Title The patient will report no pain with daily activities (driving, overhead lifting).    Time 6    Period Weeks    Status Partially Met    Target Date 04/20/21      PT LONG TERM GOAL #3   Title The patient will demo overhead lifting of 20 lbs without c/o L shoulder/cervical pain.    Baseline able to lift 20# box overhead (with both hands) without pain -02/28/21    Time 6    Period Weeks    Status Achieved    Target Date 03/07/21      PT LONG TERM GOAL #4   Title The patient will improve functional status score from 70% up to 80% to  demo reduced perception of pain.    Time 6    Period Weeks    Status On-going    Target Date 04/20/21                 Plan - 03/09/21 1009    Clinical Impression Statement Continued progress with increasing cervical mobility/ROM and decreasing pain. Patient has muscular tightness through the Lt ant/lat/post cervical musculature; upper traps; pecs; leveator. Excellent repsonse to DN/manual work and ther ex. Patient has incresaed functional activities with decreasing pain. He continues to work to modify posture and positions at work as well as improve postural strength. Patient will benefit from continued treatment to address remaining muscular tightness and pain/discomfort.    Rehab Potential Good    PT Frequency 2x / week    PT Duration 6 weeks    PT Treatment/Interventions ADLs/Self Care Home Management;Dry needling;Manual techniques;Therapeutic activities;Therapeutic exercise;Patient/family education;Taping;Cryotherapy;Moist Heat;Electrical Stimulation;Iontophoresis 15m/ml Dexamethasone;Passive range of motion    PT Next Visit Plan Continue PT focus on ergonomic correctioins; DN/manual work    PT HTampa          Patient will benefit from skilled therapeutic intervention in order to improve the following deficits and impairments:     Visit Diagnosis: Chronic left shoulder pain  Other symptoms and signs involving the musculoskeletal system     Problem List Patient Active Problem List   Diagnosis Date Noted  . Left ear pain 02/16/2021  . Low testosterone in male 07/01/2020  . Mixed hyperlipidemia 06/13/2020  . Anxiety state 04/21/2016  . ADHD, predominantly inattentive type 01/25/2016  . Tobacco use disorder 01/25/2016    Danny Edwards PNilda SimmerPT, MPH  03/09/2021, 11:20 AM  [facility] [address]Phone: [phone]  Fax:  [phone] Name: Danny ANTHISMRN:  [id number] of Birth: 109/22/1987

## 2021-03-11 ENCOUNTER — Telehealth: Payer: Self-pay | Admitting: Family Medicine

## 2021-03-11 NOTE — Telephone Encounter (Signed)
Called patient got recording mailbox is full and  Can not receive any messages at this time and disconnected call   [phone]

## 2021-03-13 DIAGNOSIS — F1120 Opioid dependence, uncomplicated: Secondary | ICD-10-CM | POA: Diagnosis not present

## 2021-03-15 ENCOUNTER — Encounter: Payer: 59 | Admitting: Rehabilitative and Restorative Service Providers"

## 2021-03-17 ENCOUNTER — Encounter: Payer: Self-pay | Admitting: Rehabilitative and Restorative Service Providers"

## 2021-03-17 ENCOUNTER — Other Ambulatory Visit: Payer: Self-pay

## 2021-03-17 ENCOUNTER — Ambulatory Visit: Payer: 59 | Admitting: Rehabilitative and Restorative Service Providers"

## 2021-03-17 DIAGNOSIS — M25512 Pain in left shoulder: Secondary | ICD-10-CM

## 2021-03-17 DIAGNOSIS — G8929 Other chronic pain: Secondary | ICD-10-CM

## 2021-03-17 DIAGNOSIS — R29898 Other symptoms and signs involving the musculoskeletal system: Secondary | ICD-10-CM | POA: Diagnosis not present

## 2021-03-17 NOTE — Patient Instructions (Signed)
Scaleni stretch handout

## 2021-03-17 NOTE — Therapy (Signed)
[place], [place], [address] Phone: [phone]   Fax:  [phone]  Physical Therapy Treatment  Patient Details  Name: Danny [place] MRN: [id number] Date of Birth: 07/31/1986 Referring Provider (PT): Eunice Blase, MD   Encounter Date: 03/17/2021   PT End of Session - 03/17/21 0850    Visit Number 11    Number of Visits 22    Date for PT Re-Evaluation 04/20/21    PT Start Time 0845    PT Stop Time 0935    PT Time Calculation (min) 50 min    Activity Tolerance Patient tolerated treatment well           Past Medical History:  Diagnosis Date  . ADHD     Past Surgical History:  Procedure Laterality Date  . KNEE SURGERY Left 2008   ACL tear    There were no vitals filed for this visit.   Subjective Assessment - 03/17/21 0850    Subjective Patient reports that he was sore after DN last visit. Had some tightness and pain in the upper back. Worked in the garden yesterday and does not have soreness or pain today.    Currently in Pain? No/denies    Pain Score 0-No pain              [facility] PT Assessment - 03/17/21 0001      Assessment   Medical Diagnosis chronic L shoulder pain    Referring Provider (PT) Eunice Blase, MD    Onset Date/Surgical Date 01/18/21    Hand Dominance Right    Next MD Visit PRN    Prior Therapy therapy for knee      AROM   Overall AROM Comments tightness end range cervical mobility/ROM      Palpation   Spinal mobility hypomobile cervical and thoracic spine with PA mobs    Palpation comment decreasing muscular tightness Lt upper quarter through the SCM; scaleni; pecs; upper trap; leveator;                         [facility] Adult PT Treatment/Exercise - 03/17/21 0001      Self-Care   Other Self-Care Comments  thoracic release with yoga egg; coregous ball; 4" ball      Shoulder Exercises: ROM/Strengthening   UBE (Upper Arm Bike) L6: 2 min  forward, 2 min backward, standing.      Shoulder Exercises: Stretch   Other Shoulder Stretches doorway stretch x 3 positions; 30 sec x 2 reps modified doorway stretch to turn slightly to Rt to increase stretch to Lt      Moist Heat Therapy   Number Minutes Moist Heat 10 Minutes    Moist Heat Location Cervical;Shoulder      Manual Therapy   Manual therapy comments skilled palpation to assess response to DN/manual work    Joint Mobilization cervical PA mobs    Soft tissue mobilization through Lt upper trap, scalenes, pec at clavical, levator, and cervical paraspinals with pt supine; thoracic paraspinals bilat to decrease fascial restrictions and improve ROM.      Neck Exercises: Stretches   Upper Trapezius Stretch Left;1 rep;20 seconds    Levator Stretch Left;1 rep;20 seconds    Other Neck Stretches scaleni stretches to Rt; Lt UE secured under hip; using Rt UE to stretch to Rt 30 sec hold x 2 reps each position chin tucked  Trigger Point Dry Needling - 03/17/21 0001    Consent Given? Yes    Education Handout Provided Previously provided    Dry Needling Comments left side    Sternocleidomastoid Response Palpable increased muscle length;Twitch response elicited    Scalenes Response Palpable increased muscle length;Twitch reponse elicited    Cervical multifidi Response Palpable increased muscle length;Twitch reponse elicited    Thoracic multifidi response Palpable increased muscle length                PT Education - 03/17/21 0955    Education Details HEP    Person(s) Educated Patient    Methods Explanation;Demonstration;Tactile cues;Verbal cues;Handout    Comprehension Verbalized understanding;Returned demonstration;Verbal cues required;Tactile cues required               PT Long Term Goals - 03/09/21 1016      PT LONG TERM GOAL #1   Title The patient will be indep with HEP for stretching and strengthening.    Time 6    Period Weeks    Status Partially  Met    Target Date 04/20/21      PT LONG TERM GOAL #2   Title The patient will report no pain with daily activities (driving, overhead lifting).    Time 6    Period Weeks    Status Partially Met    Target Date 04/20/21      PT LONG TERM GOAL #3   Title The patient will demo overhead lifting of 20 lbs without c/o L shoulder/cervical pain.    Baseline able to lift 20# box overhead (with both hands) without pain -02/28/21    Time 6    Period Weeks    Status Achieved    Target Date 03/07/21      PT LONG TERM GOAL #4   Title The patient will improve functional status score from 70% up to 80% to demo reduced perception of pain.    Time 6    Period Weeks    Status On-going    Target Date 04/20/21                 Plan - 03/17/21 0855    Clinical Impression Statement Patient respondes well to DN and manual work in clinic. He continues to work on his exercises at home. Continued with mobilization and stretching modifying the stretch to increase thoracic extension.    Rehab Potential Good    PT Frequency 2x / week    PT Duration 6 weeks    PT Treatment/Interventions ADLs/Self Care Home Management;Dry needling;Manual techniques;Therapeutic activities;Therapeutic exercise;Patient/family education;Taping;Cryotherapy;Moist Heat;Electrical Stimulation;Iontophoresis 67m/ml Dexamethasone;Passive range of motion    PT Next Visit Plan Continue PT focus on ergonomic correctioins; DN/manual work    PT HGrandfatherand Agree with Plan of Care Patient           Patient will benefit from skilled therapeutic intervention in order to improve the following deficits and impairments:     Visit Diagnosis: Chronic left shoulder pain  Other symptoms and signs involving the musculoskeletal system     Problem List Patient Active Problem List   Diagnosis Date Noted  . Left ear pain 02/16/2021  . Low testosterone in male 07/01/2020  . Mixed hyperlipidemia  06/13/2020  . Anxiety state 04/21/2016  . ADHD, predominantly inattentive type 01/25/2016  . Tobacco use disorder 01/25/2016    Fredrika Canby PNilda SimmerPT, MPH  03/17/2021, 9:57 AM  [facility] [place], [place], [address] Phone: [phone]   Fax:  [phone]  Name: Danny [place] MRN: [id number] Date of Birth: [birth year]

## 2021-03-20 DIAGNOSIS — F1120 Opioid dependence, uncomplicated: Secondary | ICD-10-CM | POA: Diagnosis not present

## 2021-03-21 ENCOUNTER — Other Ambulatory Visit: Payer: Self-pay

## 2021-03-21 ENCOUNTER — Ambulatory Visit (INDEPENDENT_AMBULATORY_CARE_PROVIDER_SITE_OTHER): Payer: 59 | Admitting: Rehabilitative and Restorative Service Providers"

## 2021-03-21 ENCOUNTER — Encounter: Payer: Self-pay | Admitting: Rehabilitative and Restorative Service Providers"

## 2021-03-21 DIAGNOSIS — G8929 Other chronic pain: Secondary | ICD-10-CM | POA: Diagnosis not present

## 2021-03-21 DIAGNOSIS — M25512 Pain in left shoulder: Secondary | ICD-10-CM | POA: Diagnosis not present

## 2021-03-21 DIAGNOSIS — R29898 Other symptoms and signs involving the musculoskeletal system: Secondary | ICD-10-CM

## 2021-03-21 NOTE — Therapy (Signed)
[place], [place], [address] Phone: [phone]   Fax:  [phone]  Physical Therapy Treatment  Patient Details  Name: Danny [place] MRN: [id number] Date of Birth: 1986/03/15 Referring Provider (PT): Eunice Blase, MD   Encounter Date: 03/21/2021   PT End of Session - 03/21/21 0935    Visit Number 12    Number of Visits 22    Date for PT Re-Evaluation 04/20/21    PT Start Time 0933    PT Stop Time 1021    PT Time Calculation (min) 48 min    Activity Tolerance Patient tolerated treatment well           Past Medical History:  Diagnosis Date  . ADHD     Past Surgical History:  Procedure Laterality Date  . KNEE SURGERY Left 2008   ACL tear    There were no vitals filed for this visit.   Subjective Assessment - 03/21/21 0936    Subjective doing well. Necj is "starting to loosen up". working on his exercises at home.    Currently in Pain? No/denies    Pain Score 0-No pain                             [facility] Adult PT Treatment/Exercise - 03/21/21 0001      Self-Care   Other Self-Care Comments  thoracic release with yoga egg; coregous ball; 4" ball; release work through the pecs and mid thoracic area      Shoulder Exercises: Supine   Other Supine Exercises thoracic ext over yoga egg x ~ 2-3 min UE's in various positons      Shoulder Exercises: Standing   Other Standing Exercises shoulder flexion with TB at wrist maintaining abducted position x 12 reps red TB      Shoulder Exercises: ROM/Strengthening   UBE (Upper Arm Bike) L6: 2 min forward, 2 min backward, standing.      Shoulder Exercises: Stretch   Other Shoulder Stretches doorway stretch x 3 positions; 30 sec x 2 reps modified doorway stretch to turn slightly to Rt to increase stretch to Lt      Manual Therapy   Manual therapy comments skilled palpation to assess response to DN/manual work    Joint  Mobilization cervical PA mobs    Soft tissue mobilization through Lt upper/mid trap, scalenes, pec at clavical, levator, and cervical paraspinals with pt supine; thoracic paraspinals bilat to decrease fascial restrictions and improve ROM.    Myofascial Release anterior shoulder    Other Manual Therapy myofacial ball release work            Trigger Point Dry Needling - 03/21/21 0001    Consent Given? Yes    Education Handout Provided Previously provided    Dry Needling Comments left side    Scalenes Response Palpable increased muscle length;Twitch reponse elicited    Cervical multifidi Response Palpable increased muscle length;Twitch reponse elicited    Pectoralis Major Response Palpable increased muscle length    Infraspinatus Response Palpable increased muscle length    Biceps Response Palpable increased muscle length    Thoracic multifidi response Palpable increased muscle length                PT Education - 03/21/21 0947    Education Details HEP    Person(s) Educated Patient    Methods Explanation;Demonstration;Tactile cues;Verbal cues;Handout    Comprehension Verbalized  understanding;Returned demonstration;Verbal cues required;Tactile cues required               PT Long Term Goals - 03/09/21 1016      PT LONG TERM GOAL #1   Title The patient will be indep with HEP for stretching and strengthening.    Time 6    Period Weeks    Status Partially Met    Target Date 04/20/21      PT LONG TERM GOAL #2   Title The patient will report no pain with daily activities (driving, overhead lifting).    Time 6    Period Weeks    Status Partially Met    Target Date 04/20/21      PT LONG TERM GOAL #3   Title The patient will demo overhead lifting of 20 lbs without c/o L shoulder/cervical pain.    Baseline able to lift 20# box overhead (with both hands) without pain -02/28/21    Time 6    Period Weeks    Status Achieved    Target Date 03/07/21      PT LONG TERM GOAL  #4   Title The patient will improve functional status score from 70% up to 80% to demo reduced perception of pain.    Time 6    Period Weeks    Status On-going    Target Date 04/20/21                 Plan - 03/21/21 0943    Clinical Impression Statement Continued improvement with decreased tightness and improved mobility. Good response to DN and manual work as well as myofacial ball release work.    Rehab Potential Good    PT Frequency 2x / week    PT Duration 6 weeks    PT Treatment/Interventions ADLs/Self Care Home Management;Dry needling;Manual techniques;Therapeutic activities;Therapeutic exercise;Patient/family education;Taping;Cryotherapy;Moist Heat;Electrical Stimulation;Iontophoresis 4m/ml Dexamethasone;Passive range of motion    PT Next Visit Plan Continue PT focus on ergonomic correctioins; DN/manual work    PT HSouthworthand Agree with Plan of Care Patient           Patient will benefit from skilled therapeutic intervention in order to improve the following deficits and impairments:     Visit Diagnosis: Chronic left shoulder pain  Other symptoms and signs involving the musculoskeletal system     Problem List Patient Active Problem List   Diagnosis Date Noted  . Left ear pain 02/16/2021  . Low testosterone in male 07/01/2020  . Mixed hyperlipidemia 06/13/2020  . Anxiety state 04/21/2016  . ADHD, predominantly inattentive type 01/25/2016  . Tobacco use disorder 01/25/2016    Greyden Besecker PNilda SimmerPT, MPH  03/21/2021, 10:14 AM  [facility] [address]Phone: [phone]  Fax:  [phone] Name: ACAMILO MANDERMRN: [id number] of Birth: [birth year]

## 2021-03-21 NOTE — Patient Instructions (Signed)
Access Code: [email or web address]: [email or web address]: 04/04/2022Prepared by: Nealie Mchatton HoltExercises  Seated Cervical Sidebending Stretch - 2 x daily - 7 x weekly - 1 sets - 3 reps - 20 seconds hold  First Rib Mobilization with Strap - 1 x daily - 7 x weekly - 3 reps - 15 seconds hold  Sternocleidomastoid Stretch - 2 x daily - 7 x weekly - 1 sets - 3 reps - 20 seconds hold  Shoulder External Rotation and Scapular Retraction with Resistance - 1 x daily - 3 x weekly - 2 sets - 10 reps  Doorway Pec Stretch at 90 Degrees Abduction - 2 x daily - 7 x weekly - 1 sets - 2 reps - 15-20 hold  Doorway Pec Stretch at 120 Degrees Abduction - 2 x daily - 7 x weekly - 1 sets - 2 reps - 15-20 hold  Sidelying Thoracic Rotation with Open Book - 1 x daily - 7 x weekly - 1 sets - 5 reps - 5-10 seconds hold  Standing Shoulder Single Arm PNF D2 Flexion with Resistance - 1 x daily - 3 x weekly - 2 sets - 10 reps  Prone Scapular Retraction Y - 2 x daily - 7 x weekly - 1 sets - 10 reps  Prone on Elbows Thoracic Rotation - 2 x daily - 7 x weekly - 1 sets - 10 reps  Supine Thoracic Mobilization Towel Roll Vertical with Arm Stretch - 2 x daily - 7 x weekly - 1 sets - 2 reps - 60 seconds hold  Shoulder Flexion Serratus Activation with Resistance - 1 x daily - 7 x weekly - 1 sets - 10 reps - 3-5 sec hold

## 2021-03-23 ENCOUNTER — Other Ambulatory Visit: Payer: Self-pay | Admitting: Family Medicine

## 2021-03-23 ENCOUNTER — Other Ambulatory Visit (HOSPITAL_BASED_OUTPATIENT_CLINIC_OR_DEPARTMENT_OTHER): Payer: Self-pay

## 2021-03-23 DIAGNOSIS — F900 Attention-deficit hyperactivity disorder, predominantly inattentive type: Secondary | ICD-10-CM

## 2021-03-23 MED FILL — Hydroxyzine HCl Tab 50 MG: ORAL | 30 days supply | Qty: 90 | Fill #0 | Status: AC

## 2021-03-24 ENCOUNTER — Encounter: Payer: 59 | Admitting: Rehabilitative and Restorative Service Providers"

## 2021-03-24 ENCOUNTER — Other Ambulatory Visit (HOSPITAL_BASED_OUTPATIENT_CLINIC_OR_DEPARTMENT_OTHER): Payer: Self-pay

## 2021-03-24 DIAGNOSIS — F1120 Opioid dependence, uncomplicated: Secondary | ICD-10-CM | POA: Diagnosis not present

## 2021-03-25 ENCOUNTER — Other Ambulatory Visit: Payer: Self-pay | Admitting: Family Medicine

## 2021-03-25 ENCOUNTER — Encounter: Payer: Self-pay | Admitting: Family Medicine

## 2021-03-25 ENCOUNTER — Ambulatory Visit: Payer: 59 | Admitting: Rehabilitative and Restorative Service Providers"

## 2021-03-25 ENCOUNTER — Encounter: Payer: Self-pay | Admitting: Rehabilitative and Restorative Service Providers"

## 2021-03-25 ENCOUNTER — Other Ambulatory Visit (HOSPITAL_BASED_OUTPATIENT_CLINIC_OR_DEPARTMENT_OTHER): Payer: Self-pay

## 2021-03-25 ENCOUNTER — Other Ambulatory Visit: Payer: Self-pay

## 2021-03-25 DIAGNOSIS — R29898 Other symptoms and signs involving the musculoskeletal system: Secondary | ICD-10-CM

## 2021-03-25 DIAGNOSIS — M25512 Pain in left shoulder: Secondary | ICD-10-CM

## 2021-03-25 DIAGNOSIS — F900 Attention-deficit hyperactivity disorder, predominantly inattentive type: Secondary | ICD-10-CM

## 2021-03-25 DIAGNOSIS — G8929 Other chronic pain: Secondary | ICD-10-CM | POA: Diagnosis not present

## 2021-03-25 MED ORDER — AMPHETAMINE-DEXTROAMPHETAMINE 30 MG PO TABS
1.0000 | ORAL_TABLET | Freq: Every day | ORAL | 0 refills | Status: DC
  Filled 2021-03-25: qty 30, 30d supply, fill #0

## 2021-03-25 MED ORDER — AMPHETAMINE-DEXTROAMPHET ER 30 MG PO CP24
ORAL_CAPSULE | Freq: Every morning | ORAL | 0 refills | Status: DC
  Filled 2021-03-25: qty 30, 30d supply, fill #0

## 2021-03-25 MED ORDER — AMPHETAMINE-DEXTROAMPHET ER 30 MG PO CP24: ORAL_CAPSULE | Freq: Every morning | ORAL | 0 refills | Status: DC

## 2021-03-25 MED ORDER — AMPHETAMINE-DEXTROAMPHETAMINE 30 MG PO TABS
1.0000 | ORAL_TABLET | Freq: Every day | ORAL | 0 refills | Status: DC
Start: 2021-03-25 — End: 2021-03-25

## 2021-03-25 NOTE — Telephone Encounter (Signed)
**  After Hours/ Emergency Line Call**  Received a call for Danny Edwards on behalf of his wife Danny Edwards who states that they are at the pharmacy awaiting to pick up his Adderall prescription which he had discussed with his PCP earlier today but that the pharmacy says they never received the prescription.  I reached out to his PCP who stated she did send it in earlier and that she would log on to her system to see if there was a hold-up.  I told the patient that I would call her back shortly and confirm that this was sent in. Patient was able to get his medication sent to pharmacy, this was confirmed with his wife.  Jackelyn Poling, DO PGY-2, [facility] 03/25/2021 5:50 PM

## 2021-03-25 NOTE — Progress Notes (Signed)
Original rx request was sent but was set to Print. Rx reordered to pharmacy. Patient made aware.

## 2021-03-25 NOTE — Patient Instructions (Signed)
Access Code: [email or web address]: [email or web address]: 04/08/2022Prepared by: Zaylon Bossier HoltExercises  Seated Cervical Sidebending Stretch - 2 x daily - 7 x weekly - 1 sets - 3 reps - 20 seconds hold  First Rib Mobilization with Strap - 1 x daily - 7 x weekly - 3 reps - 15 seconds hold  Sternocleidomastoid Stretch - 2 x daily - 7 x weekly - 1 sets - 3 reps - 20 seconds hold  Shoulder External Rotation and Scapular Retraction with Resistance - 1 x daily - 3 x weekly - 2 sets - 10 reps  Doorway Pec Stretch at 90 Degrees Abduction - 2 x daily - 7 x weekly - 1 sets - 2 reps - 15-20 hold  Doorway Pec Stretch at 120 Degrees Abduction - 2 x daily - 7 x weekly - 1 sets - 2 reps - 15-20 hold  Sidelying Thoracic Rotation with Open Book - 1 x daily - 7 x weekly - 1 sets - 5 reps - 5-10 seconds hold  Standing Shoulder Single Arm PNF D2 Flexion with Resistance - 1 x daily - 3 x weekly - 2 sets - 10 reps  Prone Scapular Retraction Y - 2 x daily - 7 x weekly - 1 sets - 10 reps  Prone on Elbows Thoracic Rotation - 2 x daily - 7 x weekly - 1 sets - 10 reps  Supine Thoracic Mobilization Towel Roll Vertical with Arm Stretch - 2 x daily - 7 x weekly - 1 sets - 2 reps - 60 seconds hold  Shoulder Flexion Serratus Activation with Resistance - 1 x daily - 7 x weekly - 1 sets - 10 reps - 3-5 sec hold  Bicep Stretch at Table - 2 x daily - 7 x weekly - 1 sets - 3 reps - 30 sec hold

## 2021-03-25 NOTE — Therapy (Signed)
[place], [place], [address] Phone: [phone]   Fax:  [phone]  Physical Therapy Treatment  Patient Details  Name: Danny [place] MRN: [id number] Date of Birth: 02/21/1986 Referring Provider (PT): Danny Blase, MD   Encounter Date: 03/25/2021   PT End of Session - 03/25/21 0846    Visit Number 13    Number of Visits 22    Date for PT Re-Evaluation 04/20/21    PT Start Time 0844    PT Stop Time 0933    PT Time Calculation (min) 49 min    Activity Tolerance Patient tolerated treatment well           Past Medical History:  Diagnosis Date  . ADHD     Past Surgical History:  Procedure Laterality Date  . KNEE SURGERY Left 2008   ACL tear    There were no vitals filed for this visit.   Subjective Assessment - 03/25/21 0846    Subjective Doing well. Much better overall. Minimal to no pain. Still has some tightness but the DN is helping that go away.    Currently in Pain? No/denies    Pain Score 0-No pain                             [facility] Adult PT Treatment/Exercise - 03/25/21 0001      Shoulder Exercises: Prone   Other Prone Exercises prone series 10 sec hold x 5 each arms at side; goal post; T; superman keeping chin tucked head in neutral position      Shoulder Exercises: ROM/Strengthening   UBE (Upper Arm Bike) L6: 2 min forward, 2 min backward, standing.      Shoulder Exercises: Stretch   Other Shoulder Stretches biceps stretch 30 sec x 3 reps standing    Other Shoulder Stretches doorway stretch x 3 positions; 30 sec x 2 reps modified doorway stretch to turn slightly to Rt to increase stretch to Lt      Moist Heat Therapy   Number Minutes Moist Heat 10 Minutes    Moist Heat Location Shoulder;Other (comment)   thoracic spine     Manual Therapy   Manual therapy comments skilled palpation to assess response to DN/manual work    Soft tissue mobilization  through Lt biceps; upper/mid trap, scalenes, pec at clavical, levator, pt supine and prone; thoracic paraspinals bilat to decrease fascial restrictions and improve ROM.    Myofascial Release anterior shoulder/biceps    Other Manual Therapy myofacial ball release work            Trigger Point Dry Needling - 03/25/21 0001    Consent Given? Yes    Education Handout Provided Previously provided    Dry Needling Comments left side    Levator Scapulae Response Palpable increased muscle length    Infraspinatus Response Palpable increased muscle length    Biceps Response Palpable increased muscle length    Thoracic multifidi response Palpable increased muscle length                PT Education - 03/25/21 0921    Education Details HEP    Person(s) Educated Patient    Methods Explanation;Demonstration;Tactile cues;Verbal cues;Handout    Comprehension Verbalized understanding;Returned demonstration;Verbal cues required;Tactile cues required               PT Long Term Goals - 03/09/21 1016  PT LONG TERM GOAL #1   Title The patient will be indep with HEP for stretching and strengthening.    Time 6    Period Weeks    Status Partially Met    Target Date 04/20/21      PT LONG TERM GOAL #2   Title The patient will report no pain with daily activities (driving, overhead lifting).    Time 6    Period Weeks    Status Partially Met    Target Date 04/20/21      PT LONG TERM GOAL #3   Title The patient will demo overhead lifting of 20 lbs without c/o L shoulder/cervical pain.    Baseline able to lift 20# box overhead (with both hands) without pain -02/28/21    Time 6    Period Weeks    Status Achieved    Target Date 03/07/21      PT LONG TERM GOAL #4   Title The patient will improve functional status score from 70% up to 80% to demo reduced perception of pain.    Time 6    Period Weeks    Status On-going    Target Date 04/20/21                 Plan - 03/25/21  0847    Clinical Impression Statement Excellent repsonse to DN/manual work and exercises. Patient has increased his activity level and is doing more of his regular activities    Rehab Potential Good    PT Frequency 2x / week    PT Duration 6 weeks    PT Treatment/Interventions ADLs/Self Care Home Management;Dry needling;Manual techniques;Therapeutic activities;Therapeutic exercise;Patient/family education;Taping;Cryotherapy;Moist Heat;Electrical Stimulation;Iontophoresis 3m/ml Dexamethasone;Passive range of motion    PT Next Visit Plan Continue PT focus on ergonomic correctioins; DN/manual work    PT HWardand Agree with Plan of Care Patient           Patient will benefit from skilled therapeutic intervention in order to improve the following deficits and impairments:     Visit Diagnosis: Chronic left shoulder pain  Other symptoms and signs involving the musculoskeletal system     Problem List Patient Active Problem List   Diagnosis Date Noted  . Left ear pain 02/16/2021  . Low testosterone in male 07/01/2020  . Mixed hyperlipidemia 06/13/2020  . Anxiety state 04/21/2016  . ADHD, predominantly inattentive type 01/25/2016  . Tobacco use disorder 01/25/2016    Maribelle Hopple PNilda SimmerPT, MPH  03/25/2021, 9:27 AM  [facility] [address]Phone: [phone]  Fax:  [phone] Name: AATTICUS WEDINMRN: [id number] of Birth: [birth year]

## 2021-03-27 DIAGNOSIS — F1120 Opioid dependence, uncomplicated: Secondary | ICD-10-CM | POA: Diagnosis not present

## 2021-03-28 ENCOUNTER — Encounter: Payer: 59 | Admitting: Rehabilitative and Restorative Service Providers"

## 2021-03-29 ENCOUNTER — Other Ambulatory Visit (HOSPITAL_BASED_OUTPATIENT_CLINIC_OR_DEPARTMENT_OTHER): Payer: Self-pay

## 2021-03-30 ENCOUNTER — Other Ambulatory Visit: Payer: Self-pay

## 2021-03-30 ENCOUNTER — Encounter: Payer: Self-pay | Admitting: Rehabilitative and Restorative Service Providers"

## 2021-03-30 ENCOUNTER — Ambulatory Visit (INDEPENDENT_AMBULATORY_CARE_PROVIDER_SITE_OTHER): Payer: 59 | Admitting: Rehabilitative and Restorative Service Providers"

## 2021-03-30 ENCOUNTER — Other Ambulatory Visit (HOSPITAL_BASED_OUTPATIENT_CLINIC_OR_DEPARTMENT_OTHER): Payer: Self-pay

## 2021-03-30 DIAGNOSIS — G8929 Other chronic pain: Secondary | ICD-10-CM | POA: Diagnosis not present

## 2021-03-30 DIAGNOSIS — R29898 Other symptoms and signs involving the musculoskeletal system: Secondary | ICD-10-CM

## 2021-03-30 DIAGNOSIS — M25512 Pain in left shoulder: Secondary | ICD-10-CM | POA: Diagnosis not present

## 2021-03-30 NOTE — Patient Instructions (Signed)
Access Code: [email or web address]: [email or web address]: 04/13/2022Prepared by: Clatie Kessen HoltExercises  Seated Cervical Sidebending Stretch - 2 x daily - 7 x weekly - 1 sets - 3 reps - 20 seconds hold  First Rib Mobilization with Strap - 1 x daily - 7 x weekly - 3 reps - 15 seconds hold  Sternocleidomastoid Stretch - 2 x daily - 7 x weekly - 1 sets - 3 reps - 20 seconds hold  Shoulder External Rotation and Scapular Retraction with Resistance - 1 x daily - 3 x weekly - 2 sets - 10 reps  Doorway Pec Stretch at 90 Degrees Abduction - 2 x daily - 7 x weekly - 1 sets - 2 reps - 15-20 hold  Doorway Pec Stretch at 120 Degrees Abduction - 2 x daily - 7 x weekly - 1 sets - 2 reps - 15-20 hold  Sidelying Thoracic Rotation with Open Book - 1 x daily - 7 x weekly - 1 sets - 5 reps - 5-10 seconds hold  Standing Shoulder Single Arm PNF D2 Flexion with Resistance - 1 x daily - 3 x weekly - 2 sets - 10 reps  Prone Scapular Retraction Y - 2 x daily - 7 x weekly - 1 sets - 10 reps  Prone on Elbows Thoracic Rotation - 2 x daily - 7 x weekly - 1 sets - 10 reps  Supine Thoracic Mobilization Towel Roll Vertical with Arm Stretch - 2 x daily - 7 x weekly - 1 sets - 2 reps - 60 seconds hold  Shoulder Flexion Serratus Activation with Resistance - 1 x daily - 7 x weekly - 1 sets - 10 reps - 3-5 sec hold  Bicep Stretch at Table - 2 x daily - 7 x weekly - 1 sets - 3 reps - 30 sec hold  Plank on Counter - 2 x daily - 7 x weekly - 1 sets - 3 reps - 60 sec hold  Plank with Thoracic Rotation on Counter - 2 x daily - 7 x weekly - 1 sets - 10 reps - 2-3 sec hold

## 2021-03-30 NOTE — Therapy (Signed)
[place], [place], [address] Phone: [phone]   Fax:  [phone]  Physical Therapy Treatment  Patient Details  Name: Danny Edwards MRN: [id number] Date of Birth: 02-Nov-1986 Referring Provider (PT): Eunice Blase, MD   Encounter Date: 03/30/2021   PT End of Session - 03/30/21 0849    Visit Number 14    Number of Visits 22    Date for PT Re-Evaluation 04/20/21    PT Start Time 0848    PT Stop Time 0930    PT Time Calculation (min) 42 min           Past Medical History:  Diagnosis Date  . ADHD     Past Surgical History:  Procedure Laterality Date  . KNEE SURGERY Left 2008   ACL tear    There were no vitals filed for this visit.   Subjective Assessment - 03/30/21 0849    Subjective Patient reports continuing to do well overall. He had to crawl under the house 2 days ago and then umpired that same night he felt some pain that night - probably from crab crawl under house. Symptoms calmed down by the next morning.    Currently in Pain? Yes    Pain Score 4     Pain Location Shoulder    Pain Orientation Left    Pain Descriptors / Indicators Tender    Pain Type Chronic pain    Pain Frequency Intermittent              [facility] PT Assessment - 03/30/21 0001      Assessment   Medical Diagnosis chronic L shoulder pain    Referring Provider (PT) Eunice Blase, MD    Onset Date/Surgical Date 01/18/21    Hand Dominance Right    Next MD Visit PRN    Prior Therapy therapy for knee      Palpation   Palpation comment muscular tightness Lt upper quarter through the biceps and anterior deltoid; minimal to no tightness in scaleni; pecs; upper trap; leveator;                         [facility] Adult PT Treatment/Exercise - 03/30/21 0001      Shoulder Exercises: Standing   Other Standing Exercises counter plank 60 sec x 3 reps; upper body rotation with arm in horizontal abduction  3 sec hold x 5 reps each side repeated biceps stretch after plank exercises      Shoulder Exercises: Stretch   Wall Stretch - ABduction Limitations horizontal abduction stretch T at wall 30 sec    Other Shoulder Stretches biceps stretch 30 sec x 3 reps standing    Other Shoulder Stretches doorway stretch x 3 positions; 30 sec x 2 reps modified doorway stretch to turn slightly to Rt to increase stretch to Lt      Moist Heat Therapy   Number Minutes Moist Heat 10 Minutes    Moist Heat Location Shoulder;Other (comment)   thoracic spine     Manual Therapy   Manual therapy comments skilled palpation to assess response to DN/manual work    Soft tissue mobilization through Lt biceps; upper/mid trap, scalenes, pec at clavical, levator, pt supine and prone; thoracic paraspinals bilat to decrease fascial restrictions and improve ROM.    Myofascial Release anterior shoulder/biceps    Other Manual Therapy myofacial ball release work  Trigger Point Dry Needling - 03/30/21 0001    Consent Given? Yes    Education Handout Provided Previously provided    Dry Needling Comments left side    Pectoralis Major Response Palpable increased muscle length;Twitch response elicited    Pectoralis Minor Response Palpable increased muscle length;Twitch response elicited    Biceps Response Palpable increased muscle length;Twitch response elicited                PT Education - 03/30/21 0905    Education Details HEP    Person(s) Educated Patient    Methods Explanation;Demonstration;Tactile cues;Verbal cues;Handout    Comprehension Verbalized understanding;Returned demonstration;Verbal cues required;Tactile cues required               PT Long Term Goals - 03/09/21 1016      PT LONG TERM GOAL #1   Title The patient will be indep with HEP for stretching and strengthening.    Time 6    Period Weeks    Status Partially Met    Target Date 04/20/21      PT LONG TERM GOAL #2   Title The  patient will report no pain with daily activities (driving, overhead lifting).    Time 6    Period Weeks    Status Partially Met    Target Date 04/20/21      PT LONG TERM GOAL #3   Title The patient will demo overhead lifting of 20 lbs without c/o L shoulder/cervical pain.    Baseline able to lift 20# box overhead (with both hands) without pain -02/28/21    Time 6    Period Weeks    Status Achieved    Target Date 03/07/21      PT LONG TERM GOAL #4   Title The patient will improve functional status score from 70% up to 80% to demo reduced perception of pain.    Time 6    Period Weeks    Status On-going    Target Date 04/20/21                 Plan - 03/30/21 0854    Clinical Impression Statement Some flare up of pain or discomfort from increased demand of tissues crawling under his house followed by umpiring a ball game. Continues to have increased tightness through anterior chest/biceps/deltoid. Added strengthening and stabilization exercises. Will decrease frequency to 1x/wk    Rehab Potential Good    PT Frequency 2x / week    PT Duration 6 weeks    PT Treatment/Interventions ADLs/Self Care Home Management;Dry needling;Manual techniques;Therapeutic activities;Therapeutic exercise;Patient/family education;Taping;Cryotherapy;Moist Heat;Electrical Stimulation;Iontophoresis 39m/ml Dexamethasone;Passive range of motion    PT Next Visit Plan Continue PT focus on ergonomic corrections; strengthening; DN/manual work    PT [place] Agree with Plan of Care Patient           Patient will benefit from skilled therapeutic intervention in order to improve the following deficits and impairments:     Visit Diagnosis: Chronic left shoulder pain  Other symptoms and signs involving the musculoskeletal system     Problem List Patient Active Problem List   Diagnosis Date Noted  . Left ear pain 02/16/2021  . Low testosterone in male 07/01/2020   . Mixed hyperlipidemia 06/13/2020  . Anxiety state 04/21/2016  . ADHD, predominantly inattentive type 01/25/2016  . Tobacco use disorder 01/25/2016    Cristel Rail PNilda Simmer PT, MPH  03/30/2021, 9:27 AM  [facility] [place], [place], [address] Phone: [phone]   Fax:  [phone]  Name: Danny Edwards MRN: [id number] Date of Birth: 1986/04/20

## 2021-03-31 ENCOUNTER — Other Ambulatory Visit (HOSPITAL_BASED_OUTPATIENT_CLINIC_OR_DEPARTMENT_OTHER): Payer: Self-pay

## 2021-04-03 DIAGNOSIS — F1120 Opioid dependence, uncomplicated: Secondary | ICD-10-CM | POA: Diagnosis not present

## 2021-04-06 ENCOUNTER — Ambulatory Visit: Payer: 59 | Admitting: Rehabilitative and Restorative Service Providers"

## 2021-04-06 ENCOUNTER — Other Ambulatory Visit: Payer: Self-pay

## 2021-04-06 ENCOUNTER — Encounter: Payer: Self-pay | Admitting: Rehabilitative and Restorative Service Providers"

## 2021-04-06 DIAGNOSIS — R29898 Other symptoms and signs involving the musculoskeletal system: Secondary | ICD-10-CM | POA: Diagnosis not present

## 2021-04-06 DIAGNOSIS — M25512 Pain in left shoulder: Secondary | ICD-10-CM

## 2021-04-06 DIAGNOSIS — G8929 Other chronic pain: Secondary | ICD-10-CM

## 2021-04-06 NOTE — Therapy (Signed)
[place], [place], [address] Phone: [phone]   Fax:  [phone]  Physical Therapy Treatment  Patient Details  Name: Danny Edwards DOBLER MRN: [id number] Date of Birth: Jan 22, 1986 Referring Provider (PT): Eunice Blase, MD   Encounter Date: 04/06/2021   PT End of Session - 04/06/21 0935    Visit Number 15    Number of Visits 22    Date for PT Re-Evaluation 04/20/21    PT Start Time 0933    PT Stop Time 1021    PT Time Calculation (min) 48 min    Activity Tolerance Patient tolerated treatment well           Past Medical History:  Diagnosis Date  . ADHD     Past Surgical History:  Procedure Laterality Date  . KNEE SURGERY Left 2008   ACL tear    There were no vitals filed for this visit.   Subjective Assessment - 04/06/21 0936    Subjective Overdid the stretch for his neck yesterday and has some soreness in the Lt clavical area. Tightness in the Lt biceps/anterior deltiod is improving. Did more lifting over the weekend and could feel it in the anterior deltoid area. Stretching helps decrease the discomfort and tightness.    Currently in Pain? No/denies              [facility] PT Assessment - 04/06/21 0001      Assessment   Medical Diagnosis chronic L shoulder pain    Referring Provider (PT) Eunice Blase, MD    Onset Date/Surgical Date 01/18/21    Hand Dominance Right    Next MD Visit PRN    Prior Therapy therapy for knee      AROM   Overall AROM Comments tightness end range cervical mobility/ROM      Palpation   Palpation comment muscular tightness Lt upper quarter through the biceps and anterior deltoid; minimal tightness in scaleni; pecs; upper trap; leveator;                         [facility] Adult PT Treatment/Exercise - 04/06/21 0001      Shoulder Exercises: Standing   Other Standing Exercises counter plank 60 sec x 3 reps; upper body rotation with arm in  horizontal abduction 3 sec hold x 5 reps each side repeated biceps stretch after plank exercises      Shoulder Exercises: Therapy Ball   Other Therapy Ball Exercises ball on wall overhead 1 min x 3 reps      Shoulder Exercises: ROM/Strengthening   UBE (Upper Arm Bike) L7: 2 min forward, 2 min backward, standing.      Shoulder Exercises: Stretch   Wall Stretch - ABduction Limitations horizontal abduction stretch T at wall 30 sec; abduction behind back 30 sec x 2 reps    Other Shoulder Stretches biceps stretch 30 sec x 3 reps standing    Other Shoulder Stretches doorway stretch x 3 positions; 30 sec x 2 reps modified doorway stretch to turn slightly to Rt to increase stretch to Lt      Moist Heat Therapy   Number Minutes Moist Heat 10 Minutes    Moist Heat Location Shoulder;Other (comment)   thoracic spine     Manual Therapy   Manual therapy comments skilled palpation to assess response to DN/manual work    Soft tissue mobilization through Lt biceps; upper/mid trap, scalenes, pec at clavical, levator, pt  supine and prone; thoracic paraspinals bilat to decrease fascial restrictions and improve ROM.    Myofascial Release anterior shoulder/biceps            Trigger Point Dry Needling - 04/06/21 0001    Consent Given? Yes    Education Handout Provided Previously provided    Dry Needling Comments left side    Sternocleidomastoid Response Palpable increased muscle length;Twitch response elicited    Pectoralis Major Response Palpable increased muscle length;Twitch response elicited    Pectoralis Minor Response Palpable increased muscle length;Twitch response elicited    Biceps Response Palpable increased muscle length;Twitch response elicited                     PT Long Term Goals - 03/09/21 1016      PT LONG TERM GOAL #1   Title The patient will be indep with HEP for stretching and strengthening.    Time 6    Period Weeks    Status Partially Met    Target Date 04/20/21       PT LONG TERM GOAL #2   Title The patient will report no pain with daily activities (driving, overhead lifting).    Time 6    Period Weeks    Status Partially Met    Target Date 04/20/21      PT LONG TERM GOAL #3   Title The patient will demo overhead lifting of 20 lbs without c/o L shoulder/cervical pain.    Baseline able to lift 20# box overhead (with both hands) without pain -02/28/21    Time 6    Period Weeks    Status Achieved    Target Date 03/07/21      PT LONG TERM GOAL #4   Title The patient will improve functional status score from 70% up to 80% to demo reduced perception of pain.    Time 6    Period Weeks    Status On-going    Target Date 04/20/21                 Plan - 04/06/21 9774    Clinical Impression Statement Improved pain level with some continued tightness in the anterior shoulder area. Patient is increasing functional activities and use of Lt UE including lifting. Working on functional strengthening in wt bearing. Gradually progressing toward stated goals of therapy.    Rehab Potential Good    PT Frequency 2x / week    PT Duration 6 weeks    PT Treatment/Interventions ADLs/Self Care Home Management;Dry needling;Manual techniques;Therapeutic activities;Therapeutic exercise;Patient/family education;Taping;Cryotherapy;Moist Heat;Electrical Stimulation;Iontophoresis 20m/ml Dexamethasone;Passive range of motion    PT Next Visit Plan Continue PT focus on ergonomic corrections; strengthening; DN/manual work    PT [place] Agree with Plan of Care Patient           Patient will benefit from skilled therapeutic intervention in order to improve the following deficits and impairments:     Visit Diagnosis: Chronic left shoulder pain  Other symptoms and signs involving the musculoskeletal system     Problem List Patient Active Problem List   Diagnosis Date Noted  . Left ear pain 02/16/2021  . Low testosterone in  male 07/01/2020  . Mixed hyperlipidemia 06/13/2020  . Anxiety state 04/21/2016  . ADHD, predominantly inattentive type 01/25/2016  . Tobacco use disorder 01/25/2016    Dniyah Grant PNilda SimmerPT, MPH  04/06/2021, 10:16 AM  [facility] [phone]  [place], [place], [address] Phone: [phone]   Fax:  [phone]  Name: AH BOTT MRN: [id number] Date of Birth: 1986-08-12

## 2021-04-10 DIAGNOSIS — F1120 Opioid dependence, uncomplicated: Secondary | ICD-10-CM | POA: Diagnosis not present

## 2021-04-13 ENCOUNTER — Encounter: Payer: 59 | Admitting: Rehabilitative and Restorative Service Providers"

## 2021-04-17 DIAGNOSIS — F1120 Opioid dependence, uncomplicated: Secondary | ICD-10-CM | POA: Diagnosis not present

## 2021-04-20 ENCOUNTER — Other Ambulatory Visit: Payer: Self-pay

## 2021-04-20 ENCOUNTER — Ambulatory Visit: Payer: 59 | Admitting: Rehabilitative and Restorative Service Providers"

## 2021-04-20 ENCOUNTER — Encounter: Payer: Self-pay | Admitting: Rehabilitative and Restorative Service Providers"

## 2021-04-20 DIAGNOSIS — G8929 Other chronic pain: Secondary | ICD-10-CM

## 2021-04-20 DIAGNOSIS — R29898 Other symptoms and signs involving the musculoskeletal system: Secondary | ICD-10-CM | POA: Diagnosis not present

## 2021-04-20 DIAGNOSIS — M25512 Pain in left shoulder: Secondary | ICD-10-CM | POA: Diagnosis not present

## 2021-04-20 NOTE — Therapy (Signed)
[facility] [place] [address] [place], [place], [address] Phone: [phone]   Fax:  [phone]  Physical Therapy Treatment  Patient Details  Name: Danny Edwards MRN: [id number] Date of Birth: 02/16/1986 Referring Provider (PT): Lavada Mesi, MD   Encounter Date: 04/20/2021   PT End of Session - 04/20/21 0845    Visit Number 16    Number of Visits 22    Date for PT Re-Evaluation 05/18/21    Authorization Type [place] choice plan    PT Start Time 0845    PT Stop Time 0917    PT Time Calculation (min) 32 min    Activity Tolerance Patient tolerated treatment well           Past Medical History:  Diagnosis Date  . ADHD     Past Surgical History:  Procedure Laterality Date  . KNEE SURGERY Left 2008   ACL tear    There were no vitals filed for this visit.   Subjective Assessment - 04/20/21 0846    Subjective Patient reports that his Lt cervical spine and shoulder areas are still doing well. He did not stretch last week d/t family stress. Feels that he would like to have an option to continue therapy for a short time. Feels he is about where he needs to be. Working on his exercises and stretches again now.    Currently in Pain? No/denies    Pain Score 0-No pain    Pain Location Shoulder              [facility] PT Assessment - 04/20/21 0001      Assessment   Medical Diagnosis chronic L shoulder pain    Referring Provider (PT) Lavada Mesi, MD    Onset Date/Surgical Date 01/18/21    Hand Dominance Right    Next MD Visit PRN    Prior Therapy therapy for knee      Observation/Other Assessments   Focus on Therapeutic Outcomes (FOTO)  70% functional status score      Posture/Postural Control   Posture Comments improving posture and alignment      Strength   Right Shoulder Flexion 5/5    Right Shoulder ABduction 5/5    Right Shoulder External Rotation 5/5    Left Shoulder Flexion 5/5    Left Shoulder  ABduction 5/5    Left Shoulder External Rotation 5/5      Palpation   Palpation comment muscular tightness Lt upper quarter through the biceps and anterior deltoid; minimal tightness in scaleni; pecs; upper trap; leveator;                         [facility] Adult PT Treatment/Exercise - 04/20/21 0001      Shoulder Exercises: Seated   Other Seated Exercises UE's behind back shoulders extended, elbows flexed - lifting chest to stretch through the anterior shoulder/chest/biceps      Shoulder Exercises: Standing   Other Standing Exercises counter plank 60 sec x 3 reps; upper body rotation with arm in horizontal abduction 3 sec hold x 5 reps each side repeated biceps stretch after plank exercises    Other Standing Exercises activation of deltiod - shoulder flexion with TB at wrist maintaining abducted position x 12 reps red TB      Shoulder Exercises: Therapy Ball   Other Therapy Ball Exercises ball on wall overhead 1 min x 3 reps      Shoulder Exercises: ROM/Strengthening  UBE (Upper Arm Bike) L7: 2 min forward, 2 min backward, standing.      Shoulder Exercises: Stretch   Wall Stretch - ABduction Limitations horizontal abduction stretch T at wall 30 sec; abduction behind back 30 sec x 2 reps    Other Shoulder Stretches biceps stretch 30 sec x 3 reps standing    Other Shoulder Stretches doorway stretch x 3 positions; 30 sec x 2 reps modified doorway stretch to turn slightly to Rt to increase stretch to Lt      Manual Therapy   Manual therapy comments skilled palpation to assess response to DN/manual work    Soft tissue mobilization through Lt biceps; upper/mid trap, scalenes, pec at clavical, levator, pt supine and prone; thoracic paraspinals bilat to decrease fascial restrictions and improve ROM.    Myofascial Release anterior shoulder/biceps            Trigger Point Dry Needling - 04/20/21 0001    Consent Given? Yes    Education Handout Provided Previously provided     Dry Needling Comments left side    Deltoid Response Palpable increased muscle length;Twitch response elicited    Biceps Response Palpable increased muscle length;Twitch response elicited                     PT Long Term Goals - 04/20/21 0923      PT LONG TERM GOAL #1   Title The patient will be indep with HEP for stretching and strengthening.    Time 6    Status On-going    Target Date 05/18/21      PT LONG TERM GOAL #2   Title The patient will report no pain with daily activities (driving, overhead lifting).    Time 6    Period Weeks    Status Achieved      PT LONG TERM GOAL #3   Title The patient will demo overhead lifting of 20 lbs without c/o L shoulder/cervical pain.    Baseline able to lift 20# box overhead (with both hands) without pain -02/28/21    Time 6    Period Weeks    Status Achieved      PT LONG TERM GOAL #4   Title The patient will improve functional status score from 70% up to 80% to demo reduced perception of pain.    Time 6    Period Weeks    Status On-going    Target Date 05/18/21                 Plan - 04/20/21 0925    Clinical Impression Statement Continued improvement - noted flare up when he was unable to do his exercises. Patient has achieved most of PT golas. He has minimal pain and 5/5 strength. He continues to have some tightness in the Lt biceps and anterior deltoid which has responded well to Dn and manual work. patient will benefit from 1-2 additioinal visits to clear muscular tightness and make sur he has a good HEP.    Rehab Potential Good    PT Frequency 2x / week    PT Duration 4 weeks    PT Treatment/Interventions ADLs/Self Care Home Management;Dry needling;Manual techniques;Therapeutic activities;Therapeutic exercise;Patient/family education;Taping;Cryotherapy;Moist Heat;Electrical Stimulation;Iontophoresis 4mg /ml Dexamethasone;Passive range of motion    PT Next Visit Plan Continue PT focus on ergonomic corrections;  strengthening; DN/manual work    PT Home Exercise Plan [email or web address]    Consulted and Agree with Plan of Care Patient  Patient will benefit from skilled therapeutic intervention in order to improve the following deficits and impairments:     Visit Diagnosis: Chronic left shoulder pain  Other symptoms and signs involving the musculoskeletal system     Problem List Patient Active Problem List   Diagnosis Date Noted  . Left ear pain 02/16/2021  . Low testosterone in male 07/01/2020  . Mixed hyperlipidemia 06/13/2020  . Anxiety state 04/21/2016  . ADHD, predominantly inattentive type 01/25/2016  . Tobacco use disorder 01/25/2016    Anelise Staron Rober Minion PT, MPH  04/20/2021, 9:27 AM  [facility] [address] [place], [place], [address] Phone: [phone]   Fax:  [phone]  Name: Danny Edwards MRN: [id number] Date of Birth: Apr 19, 1986

## 2021-04-24 DIAGNOSIS — F1120 Opioid dependence, uncomplicated: Secondary | ICD-10-CM | POA: Diagnosis not present

## 2021-04-27 ENCOUNTER — Encounter: Payer: 59 | Admitting: Rehabilitative and Restorative Service Providers"

## 2021-05-01 DIAGNOSIS — F1120 Opioid dependence, uncomplicated: Secondary | ICD-10-CM | POA: Diagnosis not present

## 2021-05-04 ENCOUNTER — Encounter: Payer: 59 | Admitting: Rehabilitative and Restorative Service Providers"

## 2021-05-08 DIAGNOSIS — F1120 Opioid dependence, uncomplicated: Secondary | ICD-10-CM | POA: Diagnosis not present

## 2021-05-10 ENCOUNTER — Other Ambulatory Visit: Payer: Self-pay | Admitting: Family Medicine

## 2021-05-10 DIAGNOSIS — F900 Attention-deficit hyperactivity disorder, predominantly inattentive type: Secondary | ICD-10-CM

## 2021-05-10 MED ORDER — AMPHETAMINE-DEXTROAMPHET ER 30 MG PO CP24
ORAL_CAPSULE | Freq: Every morning | ORAL | 0 refills | Status: DC
  Filled 2021-05-10: qty 30, 30d supply, fill #0

## 2021-05-10 MED ORDER — AMPHETAMINE-DEXTROAMPHETAMINE 30 MG PO TABS
1.0000 | ORAL_TABLET | Freq: Every day | ORAL | 0 refills | Status: DC
  Filled 2021-05-10: qty 30, 30d supply, fill #0

## 2021-05-10 NOTE — Telephone Encounter (Signed)
Covering Dr. Quentin Cornwall box this week. Last seen in March 2022. Notes show follow up needed q3 months for ADHD. Patient will need follow up next month for refill.

## 2021-05-11 ENCOUNTER — Ambulatory Visit: Payer: 59 | Admitting: Rehabilitative and Restorative Service Providers"

## 2021-05-11 ENCOUNTER — Other Ambulatory Visit (HOSPITAL_BASED_OUTPATIENT_CLINIC_OR_DEPARTMENT_OTHER): Payer: Self-pay

## 2021-05-11 ENCOUNTER — Encounter: Payer: Self-pay | Admitting: Rehabilitative and Restorative Service Providers"

## 2021-05-11 ENCOUNTER — Other Ambulatory Visit: Payer: Self-pay

## 2021-05-11 DIAGNOSIS — R29898 Other symptoms and signs involving the musculoskeletal system: Secondary | ICD-10-CM | POA: Diagnosis not present

## 2021-05-11 DIAGNOSIS — G8929 Other chronic pain: Secondary | ICD-10-CM

## 2021-05-11 DIAGNOSIS — M25512 Pain in left shoulder: Secondary | ICD-10-CM

## 2021-05-11 NOTE — Therapy (Signed)
[facility] [place] [address] [place], [place], [address] Phone: [phone]   Fax:  [phone]  Physical Therapy Treatment  Patient Details  Name: Danny Edwards MRN: [id number] Date of Birth: 1986/07/01 Referring Provider (PT): Lavada Mesi, MD   Encounter Date: 05/11/2021   PT End of Session - 05/11/21 0849    Visit Number 17    Number of Visits 22    Date for PT Re-Evaluation 05/18/21    PT Start Time 0848    PT Stop Time 0927    PT Time Calculation (min) 39 min    Activity Tolerance Patient tolerated treatment well           Past Medical History:  Diagnosis Date  . ADHD     Past Surgical History:  Procedure Laterality Date  . KNEE SURGERY Left 2008   ACL tear    There were no vitals filed for this visit.   Subjective Assessment - 05/11/21 0850    Subjective Patient reports that he has had increased symptoms in the past two weeks. Has been in the hospital with his wife and baby for 7 days. Everything is tight and feels like is "needs to pop" He has some discomfort with carrying new baby in carrier.    Currently in Pain? Yes    Pain Score 5     Pain Location Shoulder    Pain Orientation Left    Pain Descriptors / Indicators Discomfort    Pain Type Chronic pain              [facility] PT Assessment - 05/11/21 0001      Assessment   Medical Diagnosis chronic L shoulder pain    Referring Provider (PT) Lavada Mesi, MD    Onset Date/Surgical Date 01/18/21    Hand Dominance Right    Next MD Visit PRN    Prior Therapy therapy for knee      Strength   Right Shoulder Flexion 5/5    Right Shoulder ABduction 5/5    Right Shoulder External Rotation 5/5    Left Shoulder Flexion 5/5    Left Shoulder ABduction 5/5    Left Shoulder Internal Rotation 5/5    Left Shoulder External Rotation 5/5      Palpation   Spinal mobility hypomobile cervical and thoracic spine with PA mobs    Palpation comment muscular  tightness Lt upper quarter through the biceps and anterior deltoid; minimal tightness in scaleni; pecs; upper trap; leveator;                         [facility] Adult PT Treatment/Exercise - 05/11/21 0001      Neck Exercises: Seated   Cervical Isometrics Extension;3 secs;5 reps    Neck Retraction 10 reps;5 secs   red TB     Shoulder Exercises: Seated   Other Seated Exercises thoracic  extension at chair 20 sec hold x 5 reps      Shoulder Exercises: Stretch   Wall Stretch - ABduction Limitations horizontal abduction stretch T at wall 30 sec; abduction behind back 30 sec x 2 reps    Other Shoulder Stretches biceps stretch 30 sec x 3 reps standing    Other Shoulder Stretches doorway stretch x 3 positions; 30 sec x 2 reps modified doorway stretch to turn slightly to Rt to increase stretch to Lt      Moist Heat Therapy   Number Minutes Moist Heat  10 Minutes    Moist Heat Location Shoulder;Other (comment);Cervical      Manual Therapy   Manual therapy comments skilled palpation to assess response to DN/manual work    Soft tissue mobilization through Lt biceps; upper/mid trap, scalenes, pec at clavical, levator, pt supine and prone; thoracic paraspinals bilat to decrease fascial restrictions and improve ROM.    Myofascial Release anterior shoulder/biceps            Trigger Point Dry Needling - 05/11/21 0001    Consent Given? Yes    Education Handout Provided Previously provided    Dry Needling Comments left side; bilat thoracic    Splenius capitus Response Palpable increased muscle length    Deltoid Response Palpable increased muscle length;Twitch response elicited    Biceps Response Palpable increased muscle length;Twitch response elicited    Thoracic multifidi response Palpable increased muscle length                PT Education - 05/11/21 0909    Education Details HEP    Person(s) Educated Patient    Methods Explanation;Demonstration;Tactile cues;Verbal  cues;Handout    Comprehension Verbalized understanding;Returned demonstration;Verbal cues required;Tactile cues required               PT Long Term Goals - 04/20/21 0923      PT LONG TERM GOAL #1   Title The patient will be indep with HEP for stretching and strengthening.    Time 6    Status On-going    Target Date 05/18/21      PT LONG TERM GOAL #2   Title The patient will report no pain with daily activities (driving, overhead lifting).    Time 6    Period Weeks    Status Achieved      PT LONG TERM GOAL #3   Title The patient will demo overhead lifting of 20 lbs without c/o L shoulder/cervical pain.    Baseline able to lift 20# box overhead (with both hands) without pain -02/28/21    Time 6    Period Weeks    Status Achieved      PT LONG TERM GOAL #4   Title The patient will improve functional status score from 70% up to 80% to demo reduced perception of pain.    Time 6    Period Weeks    Status On-going    Target Date 05/18/21                 Plan - 05/11/21 0854    Clinical Impression Statement Flare up of symptoms with sleeping in the hospital and changes in activity level. Patient has discomfort in the shoulder and midback which is worse with carrying and lifting. No change in ROM or strength. patient has increased muscular tightness in the mid thoracic and cervical musculature as well as Lt cervical to upper trap/biceps.Patient will benefit from continued PT to resolve current flare up of symptoms.    Rehab Potential Good    PT Frequency 2x / week    PT Duration 4 weeks    PT Treatment/Interventions ADLs/Self Care Home Management;Dry needling;Manual techniques;Therapeutic activities;Therapeutic exercise;Patient/family education;Taping;Cryotherapy;Moist Heat;Electrical Stimulation;Iontophoresis 4mg /ml Dexamethasone;Passive range of motion    PT Next Visit Plan Continue PT focus on ergonomic corrections; strengthening; DN/manual work    PT Home Exercise Plan  [email or web address]    Consulted and Agree with Plan of Care Patient           Patient will benefit from skilled therapeutic  intervention in order to improve the following deficits and impairments:     Visit Diagnosis: Chronic left shoulder pain  Other symptoms and signs involving the musculoskeletal system     Problem List Patient Active Problem List   Diagnosis Date Noted  . Left ear pain 02/16/2021  . Low testosterone in male 07/01/2020  . Mixed hyperlipidemia 06/13/2020  . Anxiety state 04/21/2016  . ADHD, predominantly inattentive type 01/25/2016  . Tobacco use disorder 01/25/2016    Conor Filsaime Rober Minion PT, MPH  05/11/2021, 9:34 AM  [facility] [address] [place], [place], [address] Phone: [phone]   Fax:  [phone]  Name: ASCENCION COYE MRN: [id number] Date of Birth: 1986/09/19

## 2021-05-11 NOTE — Patient Instructions (Signed)
Access Code: [email or web address]: [email or web address]: 05/25/2022Prepared by: Alexiss Iturralde HoltExercises  Seated Cervical Sidebending Stretch - 2 x daily - 7 x weekly - 1 sets - 3 reps - 20 seconds hold  First Rib Mobilization with Strap - 1 x daily - 7 x weekly - 3 reps - 15 seconds hold  Sternocleidomastoid Stretch - 2 x daily - 7 x weekly - 1 sets - 3 reps - 20 seconds hold  Shoulder External Rotation and Scapular Retraction with Resistance - 1 x daily - 3 x weekly - 2 sets - 10 reps  Doorway Pec Stretch at 90 Degrees Abduction - 2 x daily - 7 x weekly - 1 sets - 2 reps - 15-20 hold  Doorway Pec Stretch at 120 Degrees Abduction - 2 x daily - 7 x weekly - 1 sets - 2 reps - 15-20 hold  Sidelying Thoracic Rotation with Open Book - 1 x daily - 7 x weekly - 1 sets - 5 reps - 5-10 seconds hold  Standing Shoulder Single Arm PNF D2 Flexion with Resistance - 1 x daily - 3 x weekly - 2 sets - 10 reps  Prone Scapular Retraction Y - 2 x daily - 7 x weekly - 1 sets - 10 reps  Prone on Elbows Thoracic Rotation - 2 x daily - 7 x weekly - 1 sets - 10 reps  Supine Thoracic Mobilization Towel Roll Vertical with Arm Stretch - 2 x daily - 7 x weekly - 1 sets - 2 reps - 60 seconds hold  Shoulder Flexion Serratus Activation with Resistance - 1 x daily - 7 x weekly - 1 sets - 10 reps - 3-5 sec hold  Bicep Stretch at Table - 2 x daily - 7 x weekly - 1 sets - 3 reps - 30 sec hold  Plank on Counter - 2 x daily - 7 x weekly - 1 sets - 3 reps - 60 sec hold  Plank with Thoracic Rotation on Counter - 2 x daily - 7 x weekly - 1 sets - 10 reps - 2-3 sec hold  Seated Thoracic Lumbar Extension with Pectoralis Stretch - 2 x daily - 7 x weekly - 1 sets - 5-8 reps - 10 sec hold  Isometric Cervical Extension at Wall with Ball - 2 x daily - 7 x weekly - 1 sets - 5-10 reps - 5-10 sec hold

## 2021-05-15 DIAGNOSIS — F1120 Opioid dependence, uncomplicated: Secondary | ICD-10-CM | POA: Diagnosis not present

## 2021-05-18 ENCOUNTER — Encounter: Payer: 59 | Admitting: Rehabilitative and Restorative Service Providers"

## 2021-05-19 ENCOUNTER — Ambulatory Visit: Payer: 59 | Admitting: Rehabilitative and Restorative Service Providers"

## 2021-05-19 ENCOUNTER — Other Ambulatory Visit: Payer: Self-pay

## 2021-05-19 ENCOUNTER — Encounter: Payer: Self-pay | Admitting: Rehabilitative and Restorative Service Providers"

## 2021-05-19 DIAGNOSIS — R29898 Other symptoms and signs involving the musculoskeletal system: Secondary | ICD-10-CM

## 2021-05-19 DIAGNOSIS — M25512 Pain in left shoulder: Secondary | ICD-10-CM | POA: Diagnosis not present

## 2021-05-19 DIAGNOSIS — G8929 Other chronic pain: Secondary | ICD-10-CM

## 2021-05-19 NOTE — Therapy (Addendum)
[address] [place] [address] [place], [place], [address] Phone: [phone]   Fax:  [phone]  Physical Therapy Treatment  Patient Details  Name: Danny Edwards MRN: [id number] Date of Birth: [birth year] Referring Provider (PT): Eunice Blase, MD   Encounter Date: 05/19/2021   PT End of Session - 05/19/21 1531     Visit Number 18    Number of Visits 22    Date for PT Re-Evaluation 05/18/21    PT Start Time 1528    PT Stop Time 1616    PT Time Calculation (min) 48 min    Activity Tolerance Patient tolerated treatment well             Past Medical History:  Diagnosis Date   ADHD     Past Surgical History:  Procedure Laterality Date   KNEE SURGERY Left 2008   ACL tear    There were no vitals filed for this visit.   Subjective Assessment - 05/19/21 1532     Subjective Neck and shoulder are feeling much better. Adjusting to life with a baby in the home. Not sleeping much. Finding time to do some exercises at home.    Currently in Pain? No/denies    Pain Score 0-No pain    Pain Location Shoulder    Pain Orientation Left                [facility] PT Assessment - 05/19/21 0001       Assessment   Medical Diagnosis chronic L shoulder pain    Referring Provider (PT) Eunice Blase, MD    Onset Date/Surgical Date 01/18/21    Hand Dominance Right    Next MD Visit PRN    Prior Therapy therapy for knee      Observation/Other Assessments   Focus on Therapeutic Outcomes (FOTO)  100      AROM   Overall AROM Comments minimal to no end range cervical mobility/ROM      Strength   Right Shoulder Flexion 5/5    Right Shoulder ABduction 5/5    Right Shoulder Internal Rotation 5/5    Right Shoulder External Rotation 5/5    Left Shoulder Flexion 5/5    Left Shoulder ABduction 5/5    Left Shoulder Internal Rotation 5/5    Left Shoulder External Rotation 5/5      Palpation   Spinal mobility hypomobile cervical  and thoracic spine with PA mobs    Palpation comment minimal persistent muscular tightness Lt upper quarter through the biceps and anterior deltoid; minimal tightness in scaleni; pecs; upper trap; leveator;                           [facility] Adult PT Treatment/Exercise - 05/19/21 0001       Neck Exercises: Seated   Cervical Isometrics Extension;3 secs;5 reps    Neck Retraction 10 reps;5 secs   red TB     Shoulder Exercises: Seated   Other Seated Exercises thoracic extension at chair with yoga egg 20 sec hold x 5 reps; upper body rotation with yoga egg 15-20 sec x 3 reps each side      Shoulder Exercises: Stretch   Wall Stretch - ABduction Limitations horizontal abduction stretch T at wall 30 sec; abduction behind back 30 sec x 2 reps with rainbow stretch x 3 reps    Other Shoulder Stretches biceps stretch 30 sec x 3 reps standing  Other Shoulder Stretches doorway stretch x 3 positions; 30 sec x 2 reps modified doorway stretch to turn slightly to Rt to increase stretch to Lt      Moist Heat Therapy   Number Minutes Moist Heat 10 Minutes    Moist Heat Location Shoulder;Other (comment);Cervical      Manual Therapy   Manual therapy comments skilled palpation to assess response to DN/manual work    Soft tissue mobilization through Lt biceps; upper/mid trap, scalenes, pec at clavical, levator, pt supine and prone; thoracic paraspinals bilat to decrease fascial restrictions and improve ROM.    Myofascial Release anterior shoulder/biceps                    PT Education - 05/19/21 1607     Education Details review HEP; functional activities    Person(s) Educated Patient    Methods Explanation;Demonstration;Tactile cues;Verbal cues;Handout    Comprehension Verbalized understanding;Returned demonstration;Verbal cues required;Tactile cues required                 PT Long Term Goals - 04/20/21 0923       PT LONG TERM GOAL #1   Title The patient will be  indep with HEP for stretching and strengthening.    Time 6    Status On-going    Target Date 05/18/21      PT LONG TERM GOAL #2   Title The patient will report no pain with daily activities (driving, overhead lifting).    Time 6    Period Weeks    Status Achieved      PT LONG TERM GOAL #3   Title The patient will demo overhead lifting of 20 lbs without c/o L shoulder/cervical pain.    Baseline able to lift 20# box overhead (with both hands) without pain -02/28/21    Time 6    Period Weeks    Status Achieved      PT LONG TERM GOAL #4   Title The patient will improve functional status score from 70% up to 80% to demo reduced perception of pain.    Time 6    Period Weeks    Status On-going    Target Date 05/18/21                   Plan - 05/19/21 1533     Clinical Impression Statement Doing well wtih no pain in the Lt neck or shoulder. Patient is working on his exercises at home. Feels comfortable with his exercises. Patient will continue with HEP and call with any problems or questions.    Rehab Potential Good    PT Frequency 2x / week    PT Duration 4 weeks    PT Treatment/Interventions ADLs/Self Care Home Management;Dry needling;Manual techniques;Therapeutic activities;Therapeutic exercise;Patient/family education;Taping;Cryotherapy;Moist Heat;Electrical Stimulation;Iontophoresis 79m/ml Dexamethasone;Passive range of motion    PT Next Visit Plan hold PT x 4 weeks with patient to call with any problems or questions    PT Home Exercise Plan [email or web address]    Consulted and Agree with Plan of Care Patient             Patient will benefit from skilled therapeutic intervention in order to improve the following deficits and impairments:     Visit Diagnosis: Chronic left shoulder pain  Other symptoms and signs involving the musculoskeletal system     Problem List Patient Active Problem List   Diagnosis Date Noted   Left ear pain 02/16/2021   Low testosterone in  male 07/01/2020   Mixed hyperlipidemia 06/13/2020   Anxiety state 04/21/2016   ADHD, predominantly inattentive type 01/25/2016   Tobacco use disorder 01/25/2016    Danny Edwards PT, MPH  05/19/2021, 4:09 PM  [facility] [address] [place], [place], [address] Phone: [phone]   Fax:  [phone]  Name: Danny Edwards MRN: [id number] Date of Birth: 22-Sep-1986  PHYSICAL THERAPY DISCHARGE SUMMARY  Visits from Start of Care: 18  Current functional level related to goals / functional outcomes: See progress note for discharge status   Remaining deficits: Needs to continue with exercise and postural correction    Education / Equipment: HEP    Patient agrees to discharge. Patient goals were met. Patient is being discharged due to meeting the stated rehab goals.   Glyn Gerads P. Levene PT, MPH 06/17/21 10:10 AM

## 2021-05-22 DIAGNOSIS — F1120 Opioid dependence, uncomplicated: Secondary | ICD-10-CM | POA: Diagnosis not present

## 2021-05-29 DIAGNOSIS — F1120 Opioid dependence, uncomplicated: Secondary | ICD-10-CM | POA: Diagnosis not present

## 2021-06-05 DIAGNOSIS — F1120 Opioid dependence, uncomplicated: Secondary | ICD-10-CM | POA: Diagnosis not present

## 2021-06-12 DIAGNOSIS — F1120 Opioid dependence, uncomplicated: Secondary | ICD-10-CM | POA: Diagnosis not present

## 2021-06-19 DIAGNOSIS — F1120 Opioid dependence, uncomplicated: Secondary | ICD-10-CM | POA: Diagnosis not present

## 2021-06-26 DIAGNOSIS — F1120 Opioid dependence, uncomplicated: Secondary | ICD-10-CM | POA: Diagnosis not present

## 2021-06-30 ENCOUNTER — Ambulatory Visit: Payer: 59 | Admitting: Student

## 2021-07-01 ENCOUNTER — Other Ambulatory Visit (HOSPITAL_BASED_OUTPATIENT_CLINIC_OR_DEPARTMENT_OTHER): Payer: Self-pay

## 2021-07-01 ENCOUNTER — Encounter: Payer: Self-pay | Admitting: Student

## 2021-07-01 ENCOUNTER — Ambulatory Visit: Payer: 59 | Admitting: Student

## 2021-07-01 ENCOUNTER — Other Ambulatory Visit: Payer: Self-pay

## 2021-07-01 DIAGNOSIS — F411 Generalized anxiety disorder: Secondary | ICD-10-CM

## 2021-07-01 DIAGNOSIS — F900 Attention-deficit hyperactivity disorder, predominantly inattentive type: Secondary | ICD-10-CM | POA: Diagnosis not present

## 2021-07-01 MED ORDER — AMPHETAMINE-DEXTROAMPHETAMINE 30 MG PO TABS
30.0000 mg | ORAL_TABLET | Freq: Every day | ORAL | 0 refills | Status: DC
  Filled 2021-07-01: qty 15, 15d supply, fill #0

## 2021-07-01 MED ORDER — AMPHETAMINE-DEXTROAMPHET ER 30 MG PO CP24
30.0000 mg | ORAL_CAPSULE | Freq: Every morning | ORAL | 0 refills | Status: DC
  Filled 2021-07-01: qty 15, 15d supply, fill #0

## 2021-07-01 MED ORDER — HYDROXYZINE HCL 50 MG PO TABS
ORAL_TABLET | ORAL | 1 refills | Status: DC
  Filled 2021-07-01: qty 90, 15d supply, fill #0
  Filled 2021-10-04: qty 90, 15d supply, fill #1

## 2021-07-01 NOTE — Assessment & Plan Note (Addendum)
ADHD, predominantly inattentive type -Consoled patient about potential drug interactions with adderall -Patient agreed to get urine drug screen results from [facility], and bring to [facility], due to cost of   test. -Continue Adderall XR 30 mg daily plus Adderall IR 30 mg daily  -15 day supply  -Will refill full prescription once urine drug screen has been received and is clean -Continue Atarax as needed -Patient signed clinic contract for continued adderall use -Follow-up 3 months

## 2021-07-01 NOTE — Patient Instructions (Signed)
It was great to see you! Thank you for allowing me to participate in your care!  Our plans for today:  - Bring/send the urine screen from [facility] to the clinic - Setup information sharing with [facility] and [facility]  - Short supply of Adderall until urine screen is received - Continue atarax as needed   Take care and seek immediate care sooner if you develop any concerns.   Clinic number: [phone]  Dr. Bess Kinds, MD [facility] Medicine

## 2021-07-01 NOTE — Progress Notes (Signed)
    SUBJECTIVE:   CHIEF COMPLAINT / HPI:   Medication refill  Endorses compliance with Adderall XR 30 mg daily in the mornings, plus Adderall IR 30 mg daily in the afternoon.  Started 'noon' diet and lost 35 llbs, no unexplained weight loss or no difficulty sleeping. Takes atarax for anxiety when needed.  Takes adderall for work, was initially started 15 years ago for school reasons. Lacks focus and has problems sitting still when not taking the meds.   Taking methadone for opoid addiction. Had knee surgery in past and got addicted to pain pills. Almost done with the program, has been going down by 3mg  every month. Current dose of methadone is 12. Goes to [facility].   Hesitation about drug screening do to cost. The lab is done outside the hospital and thus his insurance won't cover the expense. It cost him 300$ per drug screen, patient would prefer to get screened at the [facility] and to share the information with [facility].     PERTINENT  PMH / PSH: Danny Edwards is a 35 y.o. male with a history of ADHD, low testosterone, HLD, and tobacco use. OBJECTIVE:   Vitals:   07/01/21 1347  BP: 121/80  Pulse: 92  SpO2: 97%     Physical Exam General: Well appearing, appropriately dressed, polite affect Cardiac: No rubs, murmurs, or gallops, normal s1/s2 Pulm: Lungs CTABL, no wheezes, or stridor Abdominal: WNL, no tenderness  ASSESSMENT/PLAN:   ADHD, predominantly inattentive type -Consoled patient about potential drug interactions with adderall -Patient agreed to get urine drug screen results from [facility], and bring to [facility], due to cost of   test. -Continue Adderall XR 30 mg daily plus Adderall IR 30 mg daily  -15 day supply  -Will refill full prescription once urine drug screen has been received and is clean -Continue Atarax as needed -Patient signed contract about adderall use -Follow-up 3 months     [facility], MD [facility]

## 2021-07-02 ENCOUNTER — Encounter (HOSPITAL_COMMUNITY): Payer: Self-pay | Admitting: Emergency Medicine

## 2021-07-02 ENCOUNTER — Emergency Department: Admission: EM | Admit: 2021-07-02 | Payer: Self-pay

## 2021-07-02 ENCOUNTER — Other Ambulatory Visit: Payer: Self-pay

## 2021-07-02 ENCOUNTER — Emergency Department (HOSPITAL_COMMUNITY)
Admission: EM | Admit: 2021-07-02 | Discharge: 2021-07-02 | Disposition: A | Discharge status: Home / Self Care | Payer: 59 | Attending: Emergency Medicine | Admitting: Emergency Medicine

## 2021-07-02 DIAGNOSIS — W2103XA Struck by baseball, initial encounter: Secondary | ICD-10-CM | POA: Insufficient documentation

## 2021-07-02 DIAGNOSIS — S46211A Strain of muscle, fascia and tendon of other parts of biceps, right arm, initial encounter: Secondary | ICD-10-CM

## 2021-07-02 DIAGNOSIS — M791 Myalgia, unspecified site: Secondary | ICD-10-CM | POA: Insufficient documentation

## 2021-07-02 DIAGNOSIS — F17210 Nicotine dependence, cigarettes, uncomplicated: Secondary | ICD-10-CM | POA: Diagnosis not present

## 2021-07-02 DIAGNOSIS — S4991XA Unspecified injury of right shoulder and upper arm, initial encounter: Secondary | ICD-10-CM | POA: Diagnosis present

## 2021-07-02 NOTE — ED Provider Notes (Signed)
[facility] EMERGENCY DEPARTMENT Provider Note   CSN: [id number] Arrival date & time: 07/02/21  1441     History Chief Complaint  Patient presents with   Elbow Pain    Danny Edwards is a 35 y.o. male.  35 yo M with a chief complaints of right arm pain at the [facility].  Feels like it is right over top of the vein in the center.  Started this morning.  Had a doctor's appointment yesterday and had his blood pressure checked in that arm.  Denies any other specific injury though has a bruise on the arm that he states it was hit by a baseball.  He denies any significant pain with movement of the elbow.  Denies fevers.  Denies blood draw from that arm.  The history is provided by the patient.  Illness Severity:  Moderate Onset quality:  Sudden Duration:  1 day Timing:  Constant Progression:  Unchanged Chronicity:  New Associated symptoms: myalgias   Associated symptoms: no abdominal pain, no chest pain, no congestion, no diarrhea, no fever, no headaches, no rash, no shortness of breath and no vomiting       Past Medical History:  Diagnosis Date   ADHD     Patient Active Problem List   Diagnosis Date Noted   Left ear pain 02/16/2021   Low testosterone in male 07/01/2020   Mixed hyperlipidemia 06/13/2020   Anxiety state 04/21/2016   ADHD, predominantly inattentive type 01/25/2016   Tobacco use disorder 01/25/2016    Past Surgical History:  Procedure Laterality Date   KNEE SURGERY Left 2008   ACL tear       Family History  Problem Relation Age of Onset   Thyroid disease Mother    Healthy Father    Healthy Sister    Healthy Brother    Healthy Brother     Social History   Tobacco Use   Smoking status: Every Day    Packs/day: 1.00    Years: 9.00    Pack years: 9.00    Types: Cigarettes   Smokeless tobacco: Former    Types: Associate Professor Use: Never used  Substance Use Topics   Alcohol use: Yes    Alcohol/week: 1.0 standard drink     Types: 1 Cans of beer per week   Drug use: No    Home Medications Prior to Admission medications   Medication Sig Start Date End Date Taking? Authorizing Provider  amphetamine-dextroamphetamine (ADDERALL XR) 30 MG 24 hr capsule Take 1 capsule (30 mg total) by mouth every morning for 15 days. 07/01/21 07/16/21  Nestor Ramp, MD  amphetamine-dextroamphetamine (ADDERALL) 30 MG tablet Take 1 tablet by mouth daily. 07/01/21     hydrOXYzine (ATARAX/VISTARIL) 50 MG tablet TAKE 1-2 TABLETS (50-100 MG TOTAL) BY MOUTH 3 (THREE) TIMES DAILY AS NEEDED FOR ANXIETY (OR INSOMNIA). 07/01/21   Bess Kinds, MD  methadone (DOLOPHINE) 10 MG tablet Take 74 mg by mouth daily.    [provider]  nabumetone (RELAFEN) 500 MG tablet TAKE 1 TABLET (500 MG TOTAL) BY MOUTH 2 (TWO) TIMES DAILY AS NEEDED. 01/19/21 01/19/22  Hilts, Casimiro Needle, MD    Allergies    Patient has no known allergies.  Review of Systems   Review of Systems  Constitutional:  Negative for chills and fever.  HENT:  Negative for congestion and facial swelling.   Eyes:  Negative for discharge and visual disturbance.  Respiratory:  Negative for shortness  of breath.   Cardiovascular:  Negative for chest pain and palpitations.  Gastrointestinal:  Negative for abdominal pain, diarrhea and vomiting.  Musculoskeletal:  Positive for arthralgias and myalgias.  Skin:  Negative for color change and rash.  Neurological:  Negative for tremors, syncope and headaches.  Psychiatric/Behavioral:  Negative for confusion and dysphoric mood.    Physical Exam Updated Vital Signs BP (!) 131/91   Pulse 86   Temp 97.9 F (36.6 C) (Oral)   Resp 18   SpO2 100%   Physical Exam Vitals and nursing note reviewed.  Constitutional:      Appearance: He is well-developed.  HENT:     Head: Normocephalic and atraumatic.  Eyes:     Pupils: Pupils are equal, round, and reactive to light.  Neck:     Vascular: No JVD.  Cardiovascular:     Rate and Rhythm:  Normal rate and regular rhythm.     Heart sounds: No murmur heard.   No friction rub. No gallop.  Pulmonary:     Effort: No respiratory distress.     Breath sounds: No wheezing.  Abdominal:     General: There is no distension.     Tenderness: There is no abdominal tenderness. There is no guarding or rebound.  Musculoskeletal:        General: Swelling and tenderness present. Normal range of motion.     Cervical back: Normal range of motion and neck supple.     Comments: Mild swelling to the right forearm there is a bruise about mid forearm that is circular.  He has some mild pain over the distal biceps tendon.  No obvious erythema or warmth to the brachial vein.  Full range of motion of the elbow able to supinate and pronate without issue.  Pulse motor and sensation intact distally.  No appreciable proximal swelling.  Skin:    Coloration: Skin is not pale.     Findings: No rash.  Neurological:     Mental Status: He is alert and oriented to person, place, and time.  Psychiatric:        Behavior: Behavior normal.    ED Results / Procedures / Treatments   Labs (all labs ordered are listed, but only abnormal results are displayed) Labs Reviewed - No data to display  EKG None  Radiology No results found.  Procedures Procedures   Medications Ordered in ED Medications - No data to display  ED Course  I have reviewed the triage vital signs and the nursing notes.  Pertinent labs & imaging results that were available during my care of the patient were reviewed by me and considered in my medical decision making (see chart for details).    MDM Rules/Calculators/A&P                          35 yo M with a chief complaints of pain to the right AC.  Most likely the patient has a biceps tendon strain versus a blunt injury from a baseball to the forearm.  He is concerned about a DVT to the upper extremity.  I discussed with him the low likelihood of this though did offer an ultrasound  which he is declining.  We will have him take Tylenol and ibuprofen.  Have him follow-up with his family doctor.  4:01 PM:  I have discussed the diagnosis/risks/treatment options with the patient and believe the pt to be eligible for discharge home to follow-up with  PCP. We also discussed returning to the ED immediately if new or worsening sx occur. We discussed the sx which are most concerning (e.g., sudden worsening pain, fever, inability to tolerate by mouth) that necessitate immediate return. Medications administered to the patient during their visit and any new prescriptions provided to the patient are listed below.  Medications given during this visit Medications - No data to display   The patient appears reasonably screen and/or stabilized for discharge and I doubt any other medical condition or other [facility] requiring further screening, evaluation, or treatment in the ED at this time prior to discharge.   Final Clinical Impression(s) / ED Diagnoses Final diagnoses:  Biceps strain, right, initial encounter    Rx / DC Orders ED Discharge Orders     None        Melene Plan, DO 07/02/21 1601

## 2021-07-02 NOTE — ED Triage Notes (Signed)
Patient here from home, complaint of elbow pain that began this morning. VSS. NAD.

## 2021-07-02 NOTE — Discharge Instructions (Addendum)
Take 4 over the counter ibuprofen tablets 3 times a day or 2 over-the-counter naproxen tablets twice a day for pain. Also take tylenol 1000mg(2 extra strength) four times a day.    

## 2021-07-03 DIAGNOSIS — F1120 Opioid dependence, uncomplicated: Secondary | ICD-10-CM | POA: Diagnosis not present

## 2021-07-10 DIAGNOSIS — F1120 Opioid dependence, uncomplicated: Secondary | ICD-10-CM | POA: Diagnosis not present

## 2021-07-17 DIAGNOSIS — F1120 Opioid dependence, uncomplicated: Secondary | ICD-10-CM | POA: Diagnosis not present

## 2021-07-24 DIAGNOSIS — F1120 Opioid dependence, uncomplicated: Secondary | ICD-10-CM | POA: Diagnosis not present

## 2021-07-31 DIAGNOSIS — F1120 Opioid dependence, uncomplicated: Secondary | ICD-10-CM | POA: Diagnosis not present

## 2021-08-01 ENCOUNTER — Other Ambulatory Visit: Payer: Self-pay | Admitting: Student

## 2021-08-01 ENCOUNTER — Encounter: Payer: Self-pay | Admitting: Student

## 2021-08-01 ENCOUNTER — Other Ambulatory Visit (HOSPITAL_BASED_OUTPATIENT_CLINIC_OR_DEPARTMENT_OTHER): Payer: Self-pay

## 2021-08-01 DIAGNOSIS — F900 Attention-deficit hyperactivity disorder, predominantly inattentive type: Secondary | ICD-10-CM

## 2021-08-03 ENCOUNTER — Other Ambulatory Visit (HOSPITAL_BASED_OUTPATIENT_CLINIC_OR_DEPARTMENT_OTHER): Payer: Self-pay

## 2021-08-03 MED ORDER — AMPHETAMINE-DEXTROAMPHETAMINE 30 MG PO TABS
1.0000 | ORAL_TABLET | Freq: Every day | ORAL | 0 refills | Status: DC
Start: 2021-08-03 — End: 2021-09-05
  Filled 2021-08-03: qty 30, 30d supply, fill #0

## 2021-08-03 MED ORDER — AMPHETAMINE-DEXTROAMPHET ER 30 MG PO CP24
ORAL_CAPSULE | Freq: Every morning | ORAL | 0 refills | Status: DC
  Filled 2021-08-03: qty 30, 30d supply, fill #0

## 2021-08-07 DIAGNOSIS — F1120 Opioid dependence, uncomplicated: Secondary | ICD-10-CM | POA: Diagnosis not present

## 2021-08-14 DIAGNOSIS — F1120 Opioid dependence, uncomplicated: Secondary | ICD-10-CM | POA: Diagnosis not present

## 2021-08-21 DIAGNOSIS — F1120 Opioid dependence, uncomplicated: Secondary | ICD-10-CM | POA: Diagnosis not present

## 2021-08-28 DIAGNOSIS — F1120 Opioid dependence, uncomplicated: Secondary | ICD-10-CM | POA: Diagnosis not present

## 2021-08-29 ENCOUNTER — Encounter: Payer: Self-pay | Admitting: Family Medicine

## 2021-08-29 NOTE — Progress Notes (Signed)
UDS from [facility] 08/01/21 positive for methadone, amphetamines, MTab.  I am aware that patient is taking both methadone and adderall.

## 2021-09-04 DIAGNOSIS — F1120 Opioid dependence, uncomplicated: Secondary | ICD-10-CM | POA: Diagnosis not present

## 2021-09-05 ENCOUNTER — Other Ambulatory Visit: Payer: Self-pay | Admitting: Student

## 2021-09-05 DIAGNOSIS — F900 Attention-deficit hyperactivity disorder, predominantly inattentive type: Secondary | ICD-10-CM

## 2021-09-05 MED ORDER — AMPHETAMINE-DEXTROAMPHETAMINE 30 MG PO TABS
1.0000 | ORAL_TABLET | Freq: Every day | ORAL | 0 refills | Status: DC
  Filled 2021-09-05: qty 30, 30d supply, fill #0

## 2021-09-05 MED ORDER — AMPHETAMINE-DEXTROAMPHET ER 30 MG PO CP24
ORAL_CAPSULE | Freq: Every morning | ORAL | 0 refills | Status: DC
Start: 2021-09-05 — End: 2021-10-04
  Filled 2021-09-05: qty 30, 30d supply, fill #0

## 2021-09-06 ENCOUNTER — Other Ambulatory Visit (HOSPITAL_BASED_OUTPATIENT_CLINIC_OR_DEPARTMENT_OTHER): Payer: Self-pay

## 2021-09-11 DIAGNOSIS — F1120 Opioid dependence, uncomplicated: Secondary | ICD-10-CM | POA: Diagnosis not present

## 2021-09-18 DIAGNOSIS — F1120 Opioid dependence, uncomplicated: Secondary | ICD-10-CM | POA: Diagnosis not present

## 2021-09-20 ENCOUNTER — Encounter: Payer: Self-pay | Admitting: Emergency Medicine

## 2021-09-20 ENCOUNTER — Other Ambulatory Visit: Payer: Self-pay

## 2021-09-20 ENCOUNTER — Other Ambulatory Visit (HOSPITAL_BASED_OUTPATIENT_CLINIC_OR_DEPARTMENT_OTHER): Payer: Self-pay

## 2021-09-20 ENCOUNTER — Emergency Department: Admit: 2021-09-20 | Discharge status: Absent without leave | Payer: Self-pay

## 2021-09-20 ENCOUNTER — Emergency Department
Admission: EM | Admit: 2021-09-20 | Discharge: 2021-09-20 | Disposition: A | Discharge status: Home / Self Care | Payer: 59 | Source: Home / Self Care

## 2021-09-20 DIAGNOSIS — J309 Allergic rhinitis, unspecified: Secondary | ICD-10-CM | POA: Diagnosis not present

## 2021-09-20 DIAGNOSIS — K122 Cellulitis and abscess of mouth: Secondary | ICD-10-CM

## 2021-09-20 MED ORDER — FEXOFENADINE HCL 180 MG PO TABS
180.0000 mg | ORAL_TABLET | Freq: Every day | ORAL | 0 refills | Status: DC
  Filled 2021-09-20: qty 30, 30d supply, fill #0

## 2021-09-20 MED ORDER — CEFDINIR 300 MG PO CAPS
300.0000 mg | ORAL_CAPSULE | Freq: Two times a day (BID) | ORAL | 0 refills | Status: AC
  Filled 2021-09-20: qty 14, 7d supply, fill #0

## 2021-09-20 NOTE — Discharge Instructions (Addendum)
Advised/instructed patient to take medication as directed with food to completion.  Advised patient to take Allegra with first dose of antibiotic for 5 of 7 days, then as needed.  Encouraged patient to increase daily water intake while taking these medications.

## 2021-09-20 NOTE — ED Provider Notes (Signed)
[facility]    CSN: [id number] Arrival date & time: 09/20/21  0841      History   Chief Complaint Chief Complaint  Patient presents with   Otalgia   Sore Throat    HPI Danny Edwards is a 35 y.o. male.   HPI 35 year old male presents with sore throat for 2 days and left ear pain that began last night.  Patient reports taking OTC Mucinex and Tylenol.  Patient is vaccinated for COVID-19.  Reports recent home COVID test was negative last night.  Patient is a current every day smoker.  Past Medical History:  Diagnosis Date   ADHD     Patient Active Problem List   Diagnosis Date Noted   Left ear pain 02/16/2021   Low testosterone in male 07/01/2020   Mixed hyperlipidemia 06/13/2020   Anxiety state 04/21/2016   ADHD, predominantly inattentive type 01/25/2016   Tobacco use disorder 01/25/2016    Past Surgical History:  Procedure Laterality Date   KNEE SURGERY Left 2008   ACL tear       Home Medications    Prior to Admission medications   Medication Sig Start Date End Date Taking? Authorizing Provider  amphetamine-dextroamphetamine (ADDERALL XR) 30 MG 24 hr capsule TAKE 1 CAPSULE (30 MG TOTAL) BY MOUTH EVERY MORNING. 09/05/21 10/06/21 Yes Sowell, Apolinar Junes, MD  amphetamine-dextroamphetamine (ADDERALL) 30 MG tablet Take 1 tablet by mouth daily. 09/05/21  Yes Sowell, Apolinar Junes, MD  cefdinir (OMNICEF) 300 MG capsule Take 1 capsule (300 mg total) by mouth 2 (two) times daily for 7 days. 09/20/21 09/27/21 Yes Trevor Iha, FNP  fexofenadine [facility] ALLERGY) 180 MG tablet Take 1 tablet (180 mg total) by mouth daily. 09/20/21 10/20/21 Yes Trevor Iha, FNP  hydrOXYzine (ATARAX/VISTARIL) 50 MG tablet TAKE 1-2 TABLETS (50-100 MG TOTAL) BY MOUTH 3 (THREE) TIMES DAILY AS NEEDED FOR ANXIETY (OR INSOMNIA). 07/01/21  Yes Sowell, Apolinar Junes, MD  methadone (DOLOPHINE) 10 MG tablet Take 74 mg by mouth daily.   Yes [provider]  nabumetone (RELAFEN) 500 MG tablet TAKE 1  TABLET (500 MG TOTAL) BY MOUTH 2 (TWO) TIMES DAILY AS NEEDED. 01/19/21 01/19/22  Hilts, Casimiro Needle, MD    Family History Family History  Problem Relation Age of Onset   Thyroid disease Mother    Healthy Father    Healthy Sister    Healthy Brother    Healthy Brother     Social History Social History   Tobacco Use   Smoking status: Every Day    Packs/day: 1.00    Years: 9.00    Pack years: 9.00    Types: Cigarettes   Smokeless tobacco: Former    Types: Associate Professor Use: Never used  Substance Use Topics   Alcohol use: Yes    Alcohol/week: 1.0 standard drink    Types: 1 Cans of beer per week   Drug use: No     Allergies   Patient has no known allergies.   Review of Systems Review of Systems  HENT:  Positive for congestion, ear pain, postnasal drip and sore throat.   All other systems reviewed and are negative.   Physical Exam Triage Vital Signs ED Triage Vitals  Enc Vitals Group     BP 09/20/21 0855 114/81     Pulse Rate 09/20/21 0855 93     Resp --      Temp 09/20/21 0855 98.4 F (36.9 C)     Temp Source 09/20/21 0855  Oral     SpO2 09/20/21 0855 98 %     Weight 09/20/21 0856 200 lb (90.7 kg)     Height 09/20/21 0856 5\' 9"  (1.753 m)     Head Circumference --      Peak Flow --      Pain Score 09/20/21 0856 4     Pain Loc --      Pain Edu? --      Excl. in GC? --    No data found.  Updated Vital Signs BP 114/81 (BP Location: Right Arm)   Pulse 93   Temp 98.4 F (36.9 C) (Oral)   Ht 5\' 9"  (1.753 m)   Wt 200 lb (90.7 kg)   SpO2 98%   BMI 29.53 kg/m      Physical Exam Vitals and nursing note reviewed.  Constitutional:      General: He is not in acute distress.    Appearance: Normal appearance. He is normal weight. He is not ill-appearing.  HENT:     Head: Normocephalic and atraumatic.     Right Ear: Tympanic membrane, ear canal and external ear normal.     Left Ear: Tympanic membrane, ear canal and external ear normal.     Nose:  Nose normal.     Mouth/Throat:     Lips: Pink.     Mouth: Mucous membranes are moist.     Pharynx: Oropharynx is clear. Uvula midline. Posterior oropharyngeal erythema and uvula swelling present.     Tonsils: Tonsillar exudate present. 2+ on the right. 2+ on the left.     Comments: Moderate amount of clear drainage of posterior oropharynx noted Eyes:     Extraocular Movements: Extraocular movements intact.     Conjunctiva/sclera: Conjunctivae normal.     Pupils: Pupils are equal, round, and reactive to light.  Cardiovascular:     Rate and Rhythm: Normal rate.     Pulses: Normal pulses.     Heart sounds: Normal heart sounds.  Pulmonary:     Effort: Pulmonary effort is normal.     Breath sounds: Normal breath sounds.     Comments: No adventitious breath sounds noted Musculoskeletal:        General: Normal range of motion.     Cervical back: Normal range of motion and neck supple. No tenderness.  Lymphadenopathy:     Cervical: No cervical adenopathy.  Skin:    General: Skin is warm and dry.  Neurological:     General: No focal deficit present.     Mental Status: He is alert and oriented to person, place, and time. Mental status is at baseline.  Psychiatric:        Mood and Affect: Mood normal.        Behavior: Behavior normal.        Thought Content: Thought content normal.     UC Treatments / Results  Labs (all labs ordered are listed, but only abnormal results are displayed) Labs Reviewed - No data to display  EKG   Radiology No results found.  Procedures Procedures (including critical care time)  Medications Ordered in UC Medications - No data to display  Initial Impression / Assessment and Plan / UC Course  I have reviewed the triage vital signs and the nursing notes.  Pertinent labs & imaging results that were available during my care of the patient were reviewed by me and considered in my medical decision making (see chart for details).     MDM:  1.   Uvulitis-Rx'd cefdinir; 2.  Allergic rhinitis-Rx'd Allegra.Advised/instructed patient to take medication as directed with food to completion.  Advised patient to take Allegra with first dose of antibiotic for 5 of 7 days, then as needed.  Encouraged patient to increase daily water intake while taking these medications.  Patient discharged home hemodynamically stable. Final Clinical Impressions(s) / UC Diagnoses   Final diagnoses:  Uvulitis  Allergic rhinitis, unspecified seasonality, unspecified trigger     Discharge Instructions      Advised/instructed patient to take medication as directed with food to completion.  Advised patient to take Allegra with first dose of antibiotic for 5 of 7 days, then as needed.  Encouraged patient to increase daily water intake while taking these medications.     ED Prescriptions     Medication Sig Dispense Auth. Provider   cefdinir (OMNICEF) 300 MG capsule Take 1 capsule (300 mg total) by mouth 2 (two) times daily for 7 days. 14 capsule Trevor Iha, FNP   fexofenadine [facility] ALLERGY) 180 MG tablet Take 1 tablet (180 mg total) by mouth daily. 30 tablet Trevor Iha, FNP      PDMP not reviewed this encounter.   Trevor Iha, FNP 09/20/21 [phone]

## 2021-09-20 NOTE — ED Triage Notes (Signed)
Patient c/o sore throat since Sunday then developed left ear pain last night.  Patient is taken Mucinex and Tylenol.  Patient is vaccinated for COVID.  Home COVID test last night was negative.

## 2021-09-26 DIAGNOSIS — F1120 Opioid dependence, uncomplicated: Secondary | ICD-10-CM | POA: Diagnosis not present

## 2021-10-03 DIAGNOSIS — F1120 Opioid dependence, uncomplicated: Secondary | ICD-10-CM | POA: Diagnosis not present

## 2021-10-04 ENCOUNTER — Other Ambulatory Visit: Payer: Self-pay | Admitting: Student

## 2021-10-04 ENCOUNTER — Other Ambulatory Visit (HOSPITAL_BASED_OUTPATIENT_CLINIC_OR_DEPARTMENT_OTHER): Payer: Self-pay

## 2021-10-04 DIAGNOSIS — F900 Attention-deficit hyperactivity disorder, predominantly inattentive type: Secondary | ICD-10-CM

## 2021-10-04 MED ORDER — AMPHETAMINE-DEXTROAMPHET ER 30 MG PO CP24
ORAL_CAPSULE | Freq: Every morning | ORAL | 0 refills | Status: DC
  Filled 2021-10-04: qty 30, 30d supply, fill #0

## 2021-10-04 MED ORDER — AMPHETAMINE-DEXTROAMPHETAMINE 30 MG PO TABS
1.0000 | ORAL_TABLET | Freq: Every day | ORAL | 0 refills | Status: DC
  Filled 2021-10-04: qty 30, 30d supply, fill #0

## 2021-10-10 DIAGNOSIS — F1120 Opioid dependence, uncomplicated: Secondary | ICD-10-CM | POA: Diagnosis not present

## 2021-10-13 ENCOUNTER — Other Ambulatory Visit (HOSPITAL_COMMUNITY): Payer: Self-pay

## 2021-10-13 MED ORDER — INFLUENZA VAC SPLIT QUAD 0.5 ML IM SUSY
PREFILLED_SYRINGE | INTRAMUSCULAR | 0 refills | Status: DC
  Filled 2021-10-13: qty 0.5, 1d supply, fill #0

## 2021-10-17 DIAGNOSIS — F1120 Opioid dependence, uncomplicated: Secondary | ICD-10-CM | POA: Diagnosis not present

## 2021-10-24 DIAGNOSIS — F1120 Opioid dependence, uncomplicated: Secondary | ICD-10-CM | POA: Diagnosis not present

## 2021-10-31 DIAGNOSIS — F1120 Opioid dependence, uncomplicated: Secondary | ICD-10-CM | POA: Diagnosis not present

## 2021-11-02 ENCOUNTER — Other Ambulatory Visit: Payer: Self-pay | Admitting: Student

## 2021-11-02 ENCOUNTER — Other Ambulatory Visit (HOSPITAL_BASED_OUTPATIENT_CLINIC_OR_DEPARTMENT_OTHER): Payer: Self-pay

## 2021-11-02 DIAGNOSIS — F900 Attention-deficit hyperactivity disorder, predominantly inattentive type: Secondary | ICD-10-CM

## 2021-11-02 DIAGNOSIS — F411 Generalized anxiety disorder: Secondary | ICD-10-CM

## 2021-11-02 MED ORDER — AMPHETAMINE-DEXTROAMPHETAMINE 30 MG PO TABS
1.0000 | ORAL_TABLET | Freq: Every day | ORAL | 0 refills | Status: DC
Start: 2021-11-02 — End: 2021-12-03
  Filled 2021-11-02: qty 30, 30d supply, fill #0

## 2021-11-02 MED ORDER — HYDROXYZINE HCL 50 MG PO TABS
50.0000 mg | ORAL_TABLET | Freq: Three times a day (TID) | ORAL | 1 refills | Status: DC | PRN
Start: 2021-11-02 — End: 2022-02-03
  Filled 2021-11-02: qty 90, 15d supply, fill #0
  Filled 2021-12-19 – 2022-01-05 (×2): qty 90, 15d supply, fill #1

## 2021-11-02 MED FILL — Amphetamine-Dextroamphetamine Cap ER 24HR 30 MG: ORAL | 30 days supply | Qty: 30 | Fill #0 | Status: AC

## 2021-11-03 ENCOUNTER — Other Ambulatory Visit (HOSPITAL_BASED_OUTPATIENT_CLINIC_OR_DEPARTMENT_OTHER): Payer: Self-pay

## 2021-11-07 DIAGNOSIS — F1120 Opioid dependence, uncomplicated: Secondary | ICD-10-CM | POA: Diagnosis not present

## 2021-11-14 DIAGNOSIS — F1120 Opioid dependence, uncomplicated: Secondary | ICD-10-CM | POA: Diagnosis not present

## 2021-11-16 ENCOUNTER — Other Ambulatory Visit (HOSPITAL_BASED_OUTPATIENT_CLINIC_OR_DEPARTMENT_OTHER): Payer: Self-pay

## 2021-11-21 DIAGNOSIS — F1120 Opioid dependence, uncomplicated: Secondary | ICD-10-CM | POA: Diagnosis not present

## 2021-11-28 DIAGNOSIS — F1120 Opioid dependence, uncomplicated: Secondary | ICD-10-CM | POA: Diagnosis not present

## 2021-12-03 ENCOUNTER — Other Ambulatory Visit: Payer: Self-pay | Admitting: Student

## 2021-12-03 DIAGNOSIS — F900 Attention-deficit hyperactivity disorder, predominantly inattentive type: Secondary | ICD-10-CM

## 2021-12-05 DIAGNOSIS — F1120 Opioid dependence, uncomplicated: Secondary | ICD-10-CM | POA: Diagnosis not present

## 2021-12-06 ENCOUNTER — Other Ambulatory Visit (HOSPITAL_BASED_OUTPATIENT_CLINIC_OR_DEPARTMENT_OTHER): Payer: Self-pay

## 2021-12-07 ENCOUNTER — Encounter: Payer: Self-pay | Admitting: Student

## 2021-12-07 MED ORDER — AMPHETAMINE-DEXTROAMPHETAMINE 30 MG PO TABS
1.0000 | ORAL_TABLET | Freq: Every day | ORAL | 0 refills | Status: DC
Start: 2021-12-07 — End: 2022-01-08
  Filled 2021-12-07: qty 30, 30d supply, fill #0

## 2021-12-07 MED ORDER — AMPHETAMINE-DEXTROAMPHET ER 30 MG PO CP24
ORAL_CAPSULE | ORAL | 0 refills | Status: DC
Start: 2021-12-07 — End: 2022-01-08
  Filled 2021-12-07: qty 30, 30d supply, fill #0

## 2021-12-08 ENCOUNTER — Other Ambulatory Visit (HOSPITAL_BASED_OUTPATIENT_CLINIC_OR_DEPARTMENT_OTHER): Payer: Self-pay

## 2021-12-12 DIAGNOSIS — F1120 Opioid dependence, uncomplicated: Secondary | ICD-10-CM | POA: Diagnosis not present

## 2021-12-19 DIAGNOSIS — F1120 Opioid dependence, uncomplicated: Secondary | ICD-10-CM | POA: Diagnosis not present

## 2021-12-20 ENCOUNTER — Other Ambulatory Visit (HOSPITAL_BASED_OUTPATIENT_CLINIC_OR_DEPARTMENT_OTHER): Payer: Self-pay

## 2021-12-26 DIAGNOSIS — F1120 Opioid dependence, uncomplicated: Secondary | ICD-10-CM | POA: Diagnosis not present

## 2021-12-28 ENCOUNTER — Other Ambulatory Visit (HOSPITAL_BASED_OUTPATIENT_CLINIC_OR_DEPARTMENT_OTHER): Payer: Self-pay

## 2021-12-30 ENCOUNTER — Ambulatory Visit: Payer: 59 | Admitting: Student

## 2021-12-30 ENCOUNTER — Encounter: Payer: Self-pay | Admitting: Student

## 2021-12-30 ENCOUNTER — Other Ambulatory Visit: Payer: Self-pay

## 2021-12-30 VITALS — BP 121/76 | HR 93 | Ht 69.0 in | Wt 214.6 lb

## 2021-12-30 DIAGNOSIS — E785 Hyperlipidemia, unspecified: Secondary | ICD-10-CM

## 2021-12-30 DIAGNOSIS — E782 Mixed hyperlipidemia: Secondary | ICD-10-CM | POA: Diagnosis not present

## 2021-12-30 DIAGNOSIS — F900 Attention-deficit hyperactivity disorder, predominantly inattentive type: Secondary | ICD-10-CM | POA: Diagnosis not present

## 2021-12-30 NOTE — Progress Notes (Signed)
° ° °  SUBJECTIVE:   CHIEF COMPLAINT / HPI:    Concerns: no concerns patient here for follow up/continuity visit since he is on adderall.  Hx of HLD Last lipid check 2021, patient not on a statin. Patient un aware of last lipid profile results. 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  Appetite is normal, no headaches or stomach aches from medicine. Patient has been trying to loose weight, using 'noom' app to learn better eating habits/plan. Is in program for methadone and doing well. Patient is almost finish with program.   PERTINENT  PMH / PSH: HLD, ADHD  OBJECTIVE:   BP 121/76    Pulse 93    Ht 5\' 9"  (1.753 m)    Wt 214 lb 9.6 oz (97.3 kg)    SpO2 100%    BMI 31.69 kg/m    Physical Exam Constitutional:      Appearance: Normal appearance.  Cardiovascular:     Rate and Rhythm: Normal rate and regular rhythm.     Pulses: Normal pulses.     Heart sounds: Normal heart sounds. No murmur heard.   No friction rub. No gallop.  Pulmonary:     Effort: Pulmonary effort is normal. No respiratory distress.     Breath sounds: Normal breath sounds. No wheezing.  Abdominal:     General: Abdomen is flat. Bowel sounds are normal. There is no distension.     Palpations: Abdomen is soft. There is no mass.     Tenderness: There is no abdominal tenderness.  Skin:    General: Skin is warm.  Neurological:     Mental Status: He is alert.  Psychiatric:        Mood and Affect: Mood normal.        Behavior: Behavior normal.        Thought Content: Thought content normal.     ASSESSMENT/PLAN:   ADHD, predominantly inattentive type Patient taking medicine responsibly, with no side effects or symptoms. Patient to bring UDS form in, for continued use  of adderrall with clinic.  -Continue Adderall -Follow up in 2-3 months  Mixed hyperlipidemia Patient last lipid panel was abnormal in 05/2020. Patient not started on medication. Will obtain lipid panel and consider starting meds if abnormal. -Lipid panel      06/2020, MD [facility]

## 2021-12-30 NOTE — Assessment & Plan Note (Addendum)
Patient taking medicine responsibly, with no side effects or symptoms. Patient to bring UDS form in, for continued use of adderrall with clinic.  -Continue Adderall -Follow up in 2-3 months

## 2021-12-30 NOTE — Patient Instructions (Signed)
It was great to see you! Thank you for allowing me to participate in your care!  Everything seems stable, good luck with work and school!   Our plans for today:  - Check a lipid panel  We are checking some labs today, I will call you if they are abnormal will send you a MyChart message or a letter if they are normal.  If you do not hear about your labs in the next 2 weeks please let us know.  Take care and seek immediate care sooner if you develop any concerns.   Dr. Bess Kinds, MD [facility]

## 2021-12-30 NOTE — Assessment & Plan Note (Signed)
Patient last lipid panel was abnormal in 05/2020. Patient not started on medication. Will obtain lipid panel and consider starting meds if abnormal. -Lipid panel

## 2021-12-31 LAB — LIPID PANEL
Chol/HDL Ratio: 5 ratio (ref 0.0–5.0)
Cholesterol, Total: 196 mg/dL (ref 100–199)
HDL: 39 mg/dL — ABNORMAL LOW (ref >39)
LDL Chol Calc (NIH): 133 mg/dL — ABNORMAL HIGH (ref 0–99)
Triglycerides: 130 mg/dL (ref 0–149)
VLDL Cholesterol Cal: 24 mg/dL (ref 5–40)

## 2022-01-02 DIAGNOSIS — F1120 Opioid dependence, uncomplicated: Secondary | ICD-10-CM | POA: Diagnosis not present

## 2022-01-05 ENCOUNTER — Other Ambulatory Visit (HOSPITAL_BASED_OUTPATIENT_CLINIC_OR_DEPARTMENT_OTHER): Payer: Self-pay

## 2022-01-08 ENCOUNTER — Other Ambulatory Visit: Payer: Self-pay | Admitting: Student

## 2022-01-08 DIAGNOSIS — F900 Attention-deficit hyperactivity disorder, predominantly inattentive type: Secondary | ICD-10-CM

## 2022-01-09 DIAGNOSIS — F1120 Opioid dependence, uncomplicated: Secondary | ICD-10-CM | POA: Diagnosis not present

## 2022-01-09 MED ORDER — AMPHETAMINE-DEXTROAMPHET ER 30 MG PO CP24
ORAL_CAPSULE | ORAL | 0 refills | Status: DC
  Filled 2022-01-09: qty 30, fill #0
  Filled 2022-01-12: qty 30, 30d supply, fill #0

## 2022-01-09 MED ORDER — AMPHETAMINE-DEXTROAMPHETAMINE 30 MG PO TABS
1.0000 | ORAL_TABLET | Freq: Every day | ORAL | 0 refills | Status: DC
  Filled 2022-01-09 – 2022-01-12 (×2): qty 30, 30d supply, fill #0

## 2022-01-10 ENCOUNTER — Other Ambulatory Visit (HOSPITAL_BASED_OUTPATIENT_CLINIC_OR_DEPARTMENT_OTHER): Payer: Self-pay

## 2022-01-11 IMAGING — DX DG SHOULDER 2+V*L*
3 series · 3 of 3 positions shown · non-contrast
Comparison: None.

CLINICAL DATA: Left shoulder injury 05/17/2020 with persistent
pain.

EXAM:
LEFT SHOULDER - 2+ VIEW

[shoulder grashey]
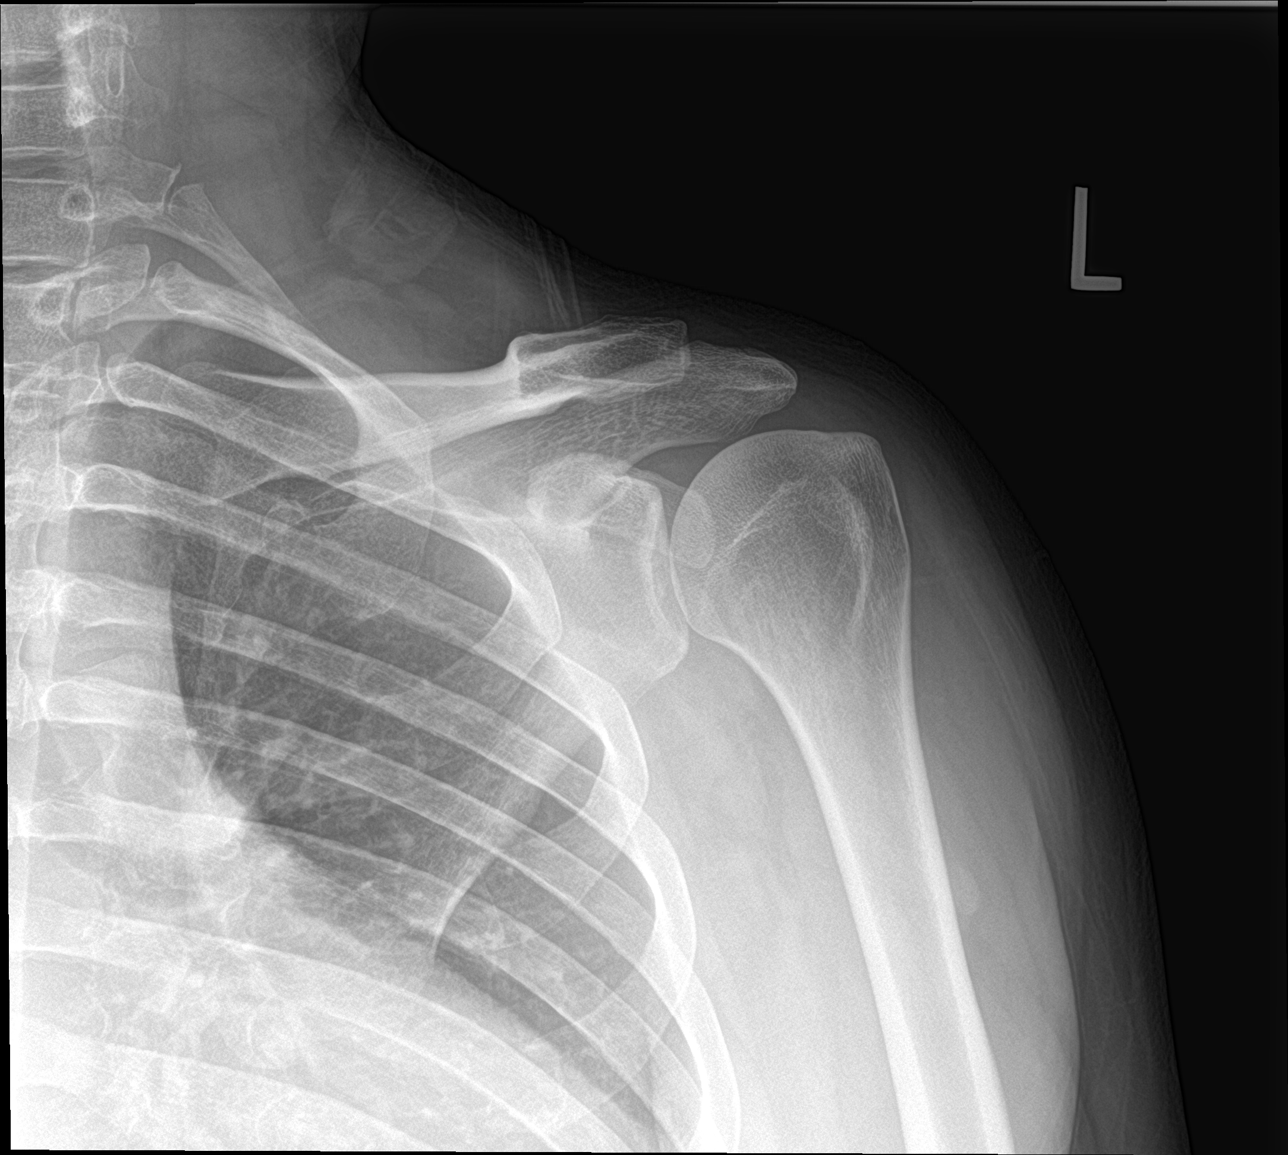

[shoulder y view]
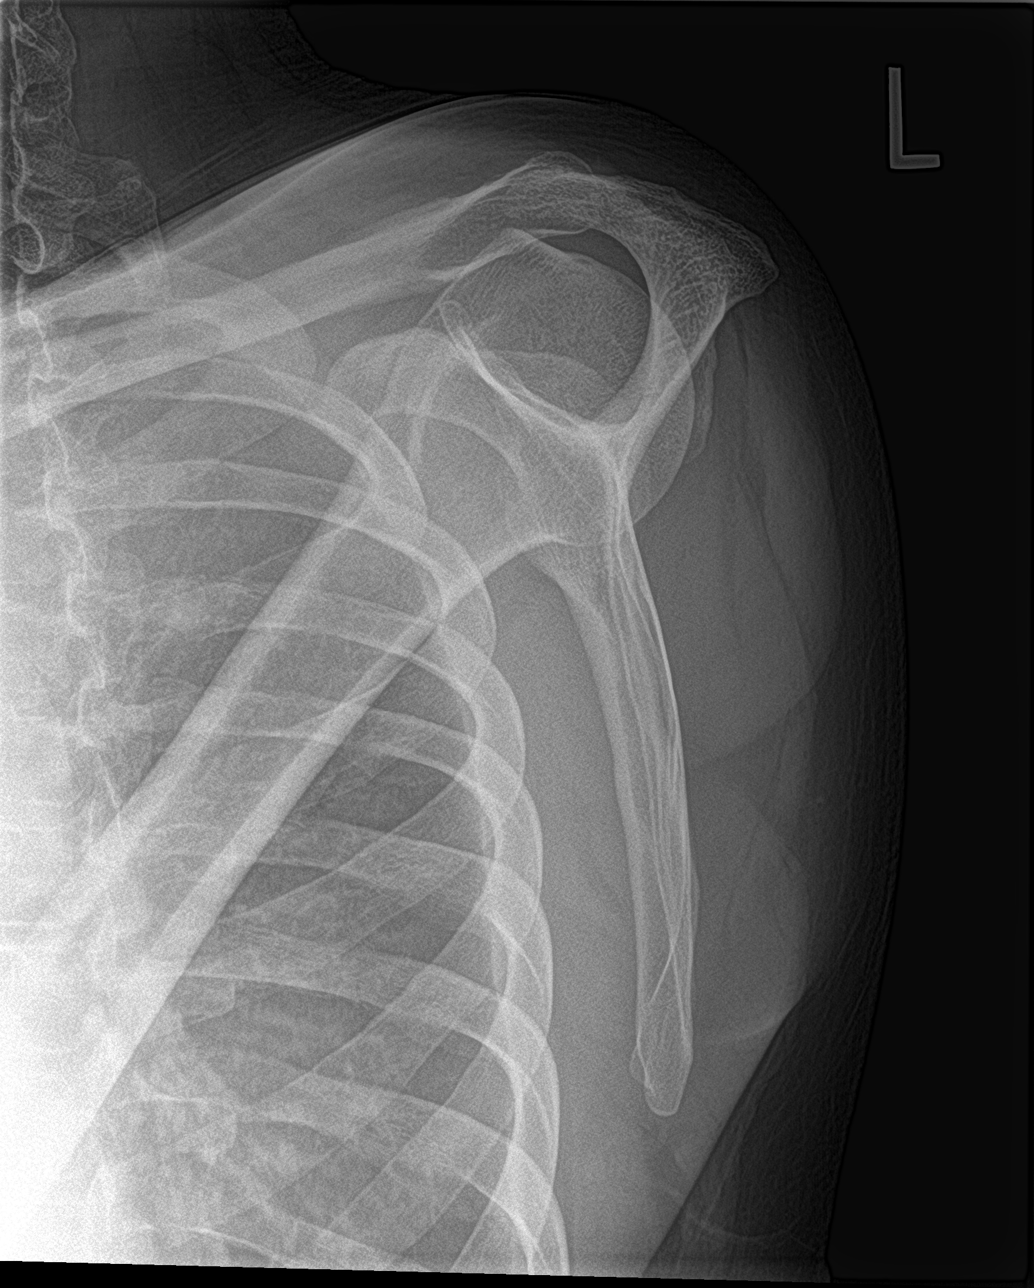

[shoulder axillary]
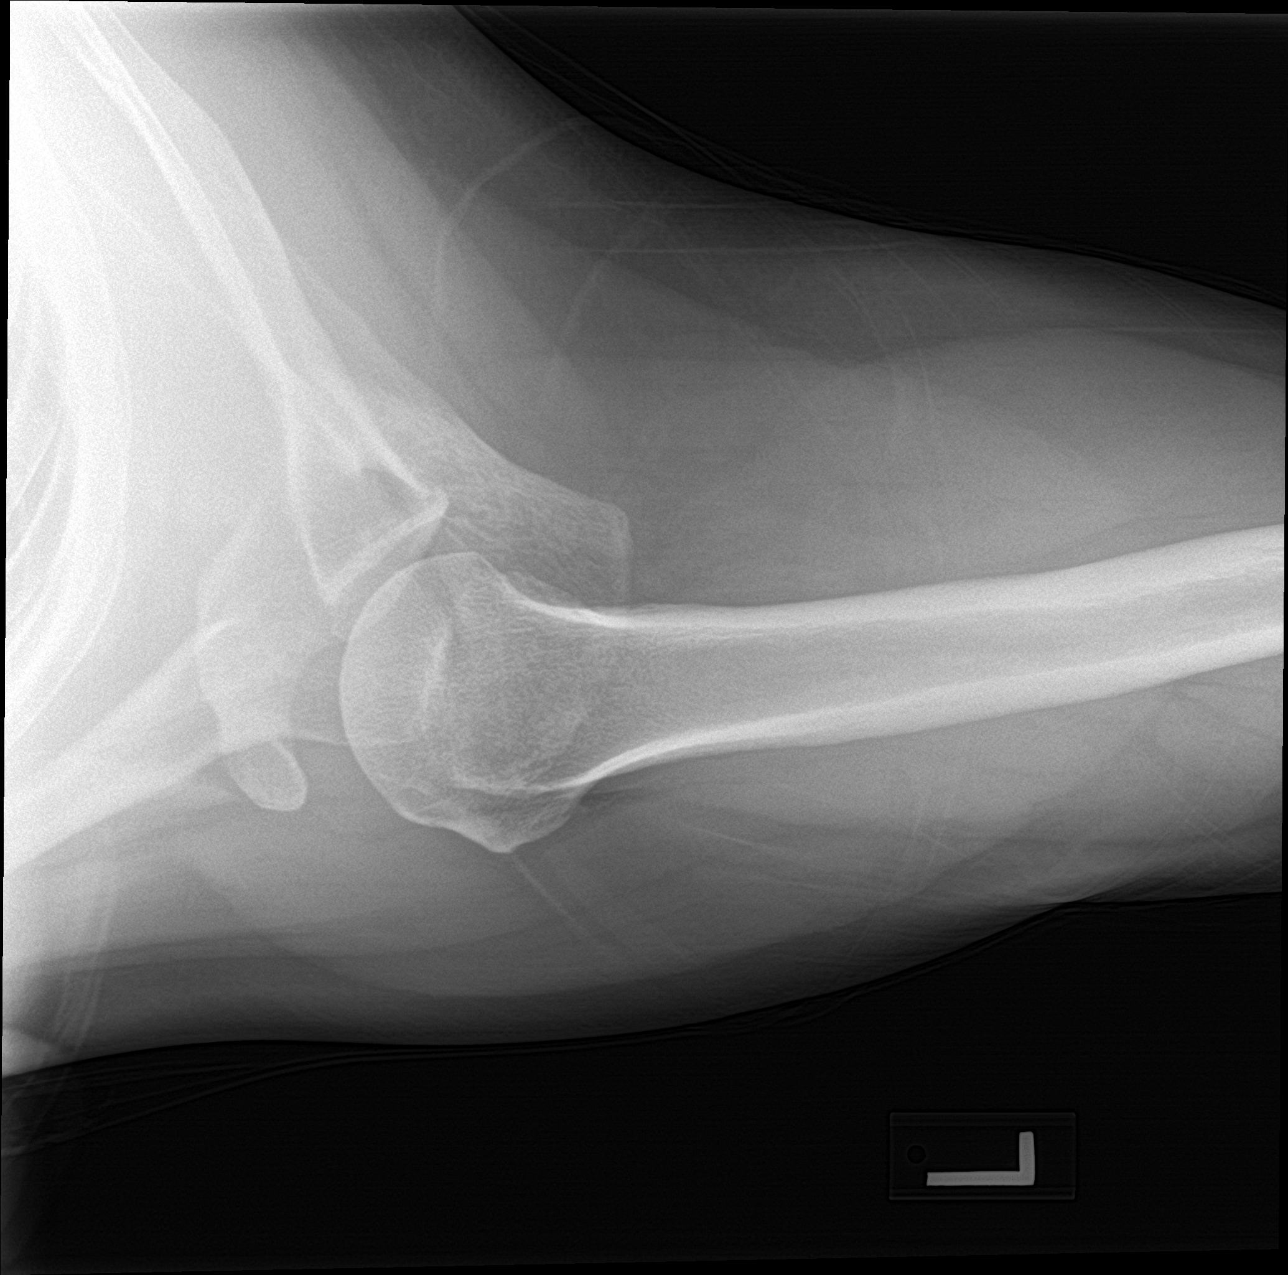

[3 of 3 positions shown; findings below may reference images not displayed]

FINDINGS: There is no evidence of fracture or dislocation. There is no
evidence of arthropathy or other focal bone abnormality. Soft
tissues are unremarkable.
IMPRESSION: Negative.

## 2022-01-12 ENCOUNTER — Other Ambulatory Visit (HOSPITAL_BASED_OUTPATIENT_CLINIC_OR_DEPARTMENT_OTHER): Payer: Self-pay

## 2022-01-16 DIAGNOSIS — F1120 Opioid dependence, uncomplicated: Secondary | ICD-10-CM | POA: Diagnosis not present

## 2022-01-23 DIAGNOSIS — F1120 Opioid dependence, uncomplicated: Secondary | ICD-10-CM | POA: Diagnosis not present

## 2022-01-30 DIAGNOSIS — F1120 Opioid dependence, uncomplicated: Secondary | ICD-10-CM | POA: Diagnosis not present

## 2022-02-03 ENCOUNTER — Other Ambulatory Visit: Payer: Self-pay | Admitting: Student

## 2022-02-03 DIAGNOSIS — F411 Generalized anxiety disorder: Secondary | ICD-10-CM

## 2022-02-03 MED ORDER — HYDROXYZINE HCL 50 MG PO TABS
50.0000 mg | ORAL_TABLET | Freq: Three times a day (TID) | ORAL | 1 refills | Status: DC | PRN
Start: 2022-02-03 — End: 2022-04-11
  Filled 2022-02-03: qty 90, 15d supply, fill #0
  Filled 2022-03-07: qty 90, 15d supply, fill #1

## 2022-02-06 ENCOUNTER — Other Ambulatory Visit (HOSPITAL_BASED_OUTPATIENT_CLINIC_OR_DEPARTMENT_OTHER): Payer: Self-pay

## 2022-02-06 DIAGNOSIS — F1120 Opioid dependence, uncomplicated: Secondary | ICD-10-CM | POA: Diagnosis not present

## 2022-02-13 ENCOUNTER — Other Ambulatory Visit (HOSPITAL_BASED_OUTPATIENT_CLINIC_OR_DEPARTMENT_OTHER): Payer: Self-pay

## 2022-02-13 ENCOUNTER — Other Ambulatory Visit: Payer: Self-pay | Admitting: Student

## 2022-02-13 DIAGNOSIS — F900 Attention-deficit hyperactivity disorder, predominantly inattentive type: Secondary | ICD-10-CM

## 2022-02-13 DIAGNOSIS — F1120 Opioid dependence, uncomplicated: Secondary | ICD-10-CM | POA: Diagnosis not present

## 2022-02-13 MED ORDER — AMPHETAMINE-DEXTROAMPHETAMINE 30 MG PO TABS
1.0000 | ORAL_TABLET | Freq: Every day | ORAL | 0 refills | Status: DC
  Filled 2022-02-13: qty 30, 30d supply, fill #0

## 2022-02-13 MED ORDER — AMPHETAMINE-DEXTROAMPHET ER 30 MG PO CP24
ORAL_CAPSULE | ORAL | 0 refills | Status: DC
  Filled 2022-02-13: qty 30, 30d supply, fill #0

## 2022-02-20 DIAGNOSIS — F1120 Opioid dependence, uncomplicated: Secondary | ICD-10-CM | POA: Diagnosis not present

## 2022-02-27 DIAGNOSIS — F1120 Opioid dependence, uncomplicated: Secondary | ICD-10-CM | POA: Diagnosis not present

## 2022-03-06 DIAGNOSIS — F1120 Opioid dependence, uncomplicated: Secondary | ICD-10-CM | POA: Diagnosis not present

## 2022-03-07 ENCOUNTER — Other Ambulatory Visit (HOSPITAL_BASED_OUTPATIENT_CLINIC_OR_DEPARTMENT_OTHER): Payer: Self-pay

## 2022-03-07 ENCOUNTER — Other Ambulatory Visit: Payer: Self-pay | Admitting: Student

## 2022-03-07 DIAGNOSIS — F900 Attention-deficit hyperactivity disorder, predominantly inattentive type: Secondary | ICD-10-CM

## 2022-03-07 MED ORDER — AMPHETAMINE-DEXTROAMPHETAMINE 30 MG PO TABS
1.0000 | ORAL_TABLET | Freq: Every day | ORAL | 0 refills | Status: DC
  Filled 2022-03-14: qty 30, 30d supply, fill #0

## 2022-03-07 MED ORDER — AMPHETAMINE-DEXTROAMPHET ER 30 MG PO CP24
ORAL_CAPSULE | ORAL | 0 refills | Status: DC
  Filled 2022-03-14: qty 30, 30d supply, fill #0

## 2022-03-13 DIAGNOSIS — F1120 Opioid dependence, uncomplicated: Secondary | ICD-10-CM | POA: Diagnosis not present

## 2022-03-14 ENCOUNTER — Other Ambulatory Visit (HOSPITAL_BASED_OUTPATIENT_CLINIC_OR_DEPARTMENT_OTHER): Payer: Self-pay

## 2022-03-20 DIAGNOSIS — F1120 Opioid dependence, uncomplicated: Secondary | ICD-10-CM | POA: Diagnosis not present

## 2022-03-21 ENCOUNTER — Other Ambulatory Visit (HOSPITAL_BASED_OUTPATIENT_CLINIC_OR_DEPARTMENT_OTHER): Payer: Self-pay

## 2022-03-27 DIAGNOSIS — F1120 Opioid dependence, uncomplicated: Secondary | ICD-10-CM | POA: Diagnosis not present

## 2022-04-03 DIAGNOSIS — F1120 Opioid dependence, uncomplicated: Secondary | ICD-10-CM | POA: Diagnosis not present

## 2022-04-10 DIAGNOSIS — F1120 Opioid dependence, uncomplicated: Secondary | ICD-10-CM | POA: Diagnosis not present

## 2022-04-11 ENCOUNTER — Other Ambulatory Visit (HOSPITAL_BASED_OUTPATIENT_CLINIC_OR_DEPARTMENT_OTHER): Payer: Self-pay

## 2022-04-11 ENCOUNTER — Other Ambulatory Visit: Payer: Self-pay | Admitting: Student

## 2022-04-11 DIAGNOSIS — F411 Generalized anxiety disorder: Secondary | ICD-10-CM

## 2022-04-11 MED ORDER — HYDROXYZINE HCL 50 MG PO TABS
50.0000 mg | ORAL_TABLET | Freq: Three times a day (TID) | ORAL | 1 refills | Status: DC | PRN
Start: 2022-04-11 — End: 2022-06-23
  Filled 2022-04-11: qty 90, 15d supply, fill #0
  Filled 2022-05-17: qty 90, 15d supply, fill #1

## 2022-04-17 DIAGNOSIS — F1120 Opioid dependence, uncomplicated: Secondary | ICD-10-CM | POA: Diagnosis not present

## 2022-04-18 ENCOUNTER — Other Ambulatory Visit: Payer: Self-pay | Admitting: Student

## 2022-04-18 DIAGNOSIS — F900 Attention-deficit hyperactivity disorder, predominantly inattentive type: Secondary | ICD-10-CM

## 2022-04-19 MED ORDER — AMPHETAMINE-DEXTROAMPHETAMINE 30 MG PO TABS
1.0000 | ORAL_TABLET | Freq: Every day | ORAL | 0 refills | Status: DC
  Filled 2022-04-19: qty 30, 30d supply, fill #0

## 2022-04-19 MED ORDER — AMPHETAMINE-DEXTROAMPHET ER 30 MG PO CP24
ORAL_CAPSULE | ORAL | 0 refills | Status: DC
  Filled 2022-04-19: qty 30, 30d supply, fill #0

## 2022-04-20 ENCOUNTER — Other Ambulatory Visit (HOSPITAL_BASED_OUTPATIENT_CLINIC_OR_DEPARTMENT_OTHER): Payer: Self-pay

## 2022-04-28 ENCOUNTER — Other Ambulatory Visit (HOSPITAL_BASED_OUTPATIENT_CLINIC_OR_DEPARTMENT_OTHER): Payer: Self-pay

## 2022-04-28 ENCOUNTER — Emergency Department
Admission: EM | Admit: 2022-04-28 | Discharge: 2022-04-28 | Disposition: A | Discharge status: Home / Self Care | Payer: 59 | Source: Home / Self Care | Attending: Family Medicine | Admitting: Family Medicine

## 2022-04-28 DIAGNOSIS — J020 Streptococcal pharyngitis: Secondary | ICD-10-CM | POA: Diagnosis not present

## 2022-04-28 DIAGNOSIS — M26622 Arthralgia of left temporomandibular joint: Secondary | ICD-10-CM | POA: Diagnosis not present

## 2022-04-28 LAB — POCT RAPID STREP A (OFFICE): Rapid Strep A Screen: POSITIVE — AB

## 2022-04-28 MED ORDER — PENICILLIN V POTASSIUM 500 MG PO TABS
ORAL_TABLET | ORAL | 0 refills | Status: DC
  Filled 2022-04-28: qty 20, 10d supply, fill #0

## 2022-04-28 NOTE — Discharge Instructions (Addendum)
For ear TMJ pain and sore throat, try Ibuprofen 200mg , 4 tabs every 8 hours with food for about 3 days. ?Try warm salt water gargles for sore throat.  ?

## 2022-04-28 NOTE — ED Triage Notes (Signed)
Pt c/o LT ear pain and sore throat since yesterday. Some post nasal drainage. Tylenol prn. Took one allegra this am. ?

## 2022-04-28 NOTE — ED Provider Notes (Signed)
?[facility] ? ? ? ?CSN: [id number] ?Arrival date & time: 04/28/22  0816 ? ? ?  ? ?History   ?Chief Complaint ?Chief Complaint  ?Patient presents with  ? Otalgia  ?  LT  ? Sore Throat  ? ? ?HPI ?Danny Edwards is a 36 y.o. male.  ? ?Patient complains of onset of left ear pain and sore throat yesterday.  He has also had post-nasal drainage, headache, myalgias, and low grade fever.  He denies cough. ? ?The history is provided by the patient.  ? ?Past Medical History:  ?Diagnosis Date  ? ADHD   ? ? ?Patient Active Problem List  ? Diagnosis Date Noted  ? Left ear pain 02/16/2021  ? Low testosterone in male 07/01/2020  ? Mixed hyperlipidemia 06/13/2020  ? Anxiety state 04/21/2016  ? ADHD, predominantly inattentive type 01/25/2016  ? Tobacco use disorder 01/25/2016  ? ? ?Past Surgical History:  ?Procedure Laterality Date  ? KNEE SURGERY Left 2008  ? ACL tear  ? ? ? ? ? ?Home Medications   ? ?Prior to Admission medications   ?Medication Sig Start Date End Date Taking? Authorizing Provider  ?penicillin v potassium (VEETID) 500 MG tablet Take one tab by mouth Q12 hours for 10 days 04/28/22  Yes Lattie Haw, MD  ?amphetamine-dextroamphetamine (ADDERALL XR) 30 MG 24 hr capsule TAKE 1 CAPSULE (30 MG TOTAL) BY MOUTH EVERY MORNING. 04/19/22   Bess Kinds, MD  ?amphetamine-dextroamphetamine (ADDERALL) 30 MG tablet Take 1 tablet by mouth daily. 04/19/22   Bess Kinds, MD  ?fexofenadine [facility] ALLERGY) 180 MG tablet Take 1 tablet (180 mg total) by mouth daily. 09/20/21 10/20/21  Trevor Iha, FNP  ?hydrOXYzine (ATARAX) 50 MG tablet Take 1-2 tablets (50-100 mg total) by mouth 3 (three) times daily as needed for anxiety or insomnia. 04/11/22   Erick Alley, DO  ?influenza vac split quadrivalent PF (FLUARIX) 0.5 ML injection Inject into the muscle. 10/13/21   Judyann Munson, MD  ?methadone (DOLOPHINE) 10 MG tablet Take 74 mg by mouth daily.    [provider]  ? ? ?Family History ?Family History  ?Problem  Relation Age of Onset  ? Thyroid disease Mother   ? Healthy Father   ? Healthy Sister   ? Healthy Brother   ? Healthy Brother   ? ? ?Social History ?Social History  ? ?Tobacco Use  ? Smoking status: Every Day  ?  Packs/day: 1.00  ?  Years: 9.00  ?  Pack years: 9.00  ?  Types: Cigarettes  ? Smokeless tobacco: Former  ?  Types: Chew  ?Vaping Use  ? Vaping Use: Never used  ?Substance Use Topics  ? Alcohol use: Yes  ?  Alcohol/week: 1.0 standard drink  ?  Types: 1 Cans of beer per week  ? Drug use: No  ? ? ? ?Allergies   ?Patient has no known allergies. ? ? ?Review of Systems ?Review of Systems ?+ sore throat ?No cough ?No pleuritic pain ?No wheezing ?+ nasal congestion ?+ post-nasal drainage ?No sinus pain/pressure ?No itchy/red eyes ?+ left earache ?No hemoptysis ?No SOB ?+ fever ?No nausea ?No vomiting ?No abdominal pain ?No diarrhea ?No urinary symptoms ?No skin rash ?+ fatigue ?+ myalgias ?+ headache ?Used OTC meds (Allegra) without relief  ? ?Physical Exam ?Triage Vital Signs ?ED Triage Vitals [04/28/22 0832]  ?Enc Vitals Group  ?   BP 117/80  ?   Pulse Rate 83  ?   Resp 17  ?  Temp 98.2 ?F (36.8 ?C)  ?   Temp Source Oral  ?   SpO2 98 %  ?   Weight   ?   Height   ?   Head Circumference   ?   Peak Flow   ?   Pain Score 4  ?   Pain Loc   ?   Pain Edu?   ?   Excl. in [place]?   ? ?No data found. ? ?Updated Vital Signs ?BP 117/80 (BP Location: Left Arm)   Pulse 83   Temp 98.2 ?F (36.8 ?C) (Oral)   Resp 17   SpO2 98%  ? ?Visual Acuity ?Right Eye Distance:   ?Left Eye Distance:   ?Bilateral Distance:   ? ?Right Eye Near:   ?Left Eye Near:    ?Bilateral Near:    ? ?Physical Exam ?Nursing notes and Vital Signs reviewed. ?Appearance:  Patient appears stated age, and in no acute distress ?Eyes:  Pupils are equal, round, and reactive to light and accomodation.  Extraocular movement is intact.  Conjunctivae are not inflamed  ?Ears:  Canals normal.  Tympanic membranes normal.  There is distinct tenderness over the left  temporomandibular joint.  Palpation there recreates his pain.  ?Nose:  Mildly congested turbinates.  No sinus tenderness.   ?Pharynx:  Mildly erythematous. ?Neck:  Supple.   Tonsillar nodes are tender to palpation.  ?Lungs:  Clear to auscultation.  Breath sounds are equal.  Moving air well. ?Heart:  Regular rate and rhythm without murmurs, rubs, or gallops.  ?Abdomen:  Nontender without masses or hepatosplenomegaly.  Bowel sounds are present.  No CVA or flank tenderness.  ?Extremities:  No edema.  ?Skin:  No rash present.  ? ?UC Treatments / Results  ?Labs ?(all labs ordered are listed, but only abnormal results are displayed) ?Labs Reviewed -   ?Tympanometry:  Right ear tympanogram normal; Left ear tympanogram "noisy" ?Rapid strep A screen:  Positive ? ?EKG ? ? ?Radiology ?No results found. ? ?Procedures ?Procedures (including critical care time) ? ?Medications Ordered in UC ?Medications - No data to display ? ?Initial Impression / Assessment and Plan / UC Course  ?I have reviewed the triage vital signs and the nursing notes. ? ?Pertinent labs & imaging results that were available during my care of the patient were reviewed by me and considered in my medical decision making (see chart for details). ? ?  ?Begin PenVK. ?Recommend follow-up with ENT for persistent left TMJ pain. ? ?Final Clinical Impressions(s) / UC Diagnoses  ? ?Final diagnoses:  ?Arthralgia of left temporomandibular joint  ?Strep pharyngitis  ? ? ? ?Discharge Instructions   ? ?  ?For ear TMJ pain and sore throat, try Ibuprofen 200mg , 4 tabs every 8 hours with food for about 3 days. ?Try warm salt water gargles for sore throat.  ? ? ? ? ?ED Prescriptions   ? ? Medication Sig Dispense Auth. Provider  ? penicillin v potassium (VEETID) 500 MG tablet Take one tab by mouth Q12 hours for 10 days 20 tablet , MD  ? ?  ? ? ?  ?Lattie Haw, MD ?04/30/22 2005 ? ?

## 2022-05-17 ENCOUNTER — Other Ambulatory Visit (HOSPITAL_BASED_OUTPATIENT_CLINIC_OR_DEPARTMENT_OTHER): Payer: Self-pay

## 2022-05-22 ENCOUNTER — Other Ambulatory Visit: Payer: Self-pay | Admitting: Student

## 2022-05-22 DIAGNOSIS — F900 Attention-deficit hyperactivity disorder, predominantly inattentive type: Secondary | ICD-10-CM

## 2022-05-22 MED ORDER — AMPHETAMINE-DEXTROAMPHETAMINE 30 MG PO TABS
1.0000 | ORAL_TABLET | Freq: Every day | ORAL | 0 refills | Status: DC
  Filled 2022-05-22: qty 30, 30d supply, fill #0

## 2022-05-22 MED ORDER — AMPHETAMINE-DEXTROAMPHET ER 30 MG PO CP24
ORAL_CAPSULE | ORAL | 0 refills | Status: DC
  Filled 2022-05-22: qty 30, 30d supply, fill #0

## 2022-05-23 ENCOUNTER — Encounter: Payer: Self-pay | Admitting: *Deleted

## 2022-05-23 ENCOUNTER — Other Ambulatory Visit (HOSPITAL_BASED_OUTPATIENT_CLINIC_OR_DEPARTMENT_OTHER): Payer: Self-pay

## 2022-05-30 ENCOUNTER — Other Ambulatory Visit (HOSPITAL_BASED_OUTPATIENT_CLINIC_OR_DEPARTMENT_OTHER): Payer: Self-pay

## 2022-05-31 ENCOUNTER — Other Ambulatory Visit (HOSPITAL_BASED_OUTPATIENT_CLINIC_OR_DEPARTMENT_OTHER): Payer: Self-pay

## 2022-06-23 ENCOUNTER — Other Ambulatory Visit: Payer: Self-pay | Admitting: Student

## 2022-06-23 DIAGNOSIS — F900 Attention-deficit hyperactivity disorder, predominantly inattentive type: Secondary | ICD-10-CM

## 2022-06-23 DIAGNOSIS — F411 Generalized anxiety disorder: Secondary | ICD-10-CM

## 2022-06-28 ENCOUNTER — Other Ambulatory Visit (HOSPITAL_BASED_OUTPATIENT_CLINIC_OR_DEPARTMENT_OTHER): Payer: Self-pay

## 2022-06-28 MED ORDER — AMPHETAMINE-DEXTROAMPHET ER 30 MG PO CP24
ORAL_CAPSULE | ORAL | 0 refills | Status: DC
  Filled 2022-06-28: qty 30, 30d supply, fill #0

## 2022-06-28 MED ORDER — AMPHETAMINE-DEXTROAMPHETAMINE 30 MG PO TABS
1.0000 | ORAL_TABLET | Freq: Every day | ORAL | 0 refills | Status: DC
  Filled 2022-06-28: qty 30, 30d supply, fill #0

## 2022-06-29 MED ORDER — HYDROXYZINE HCL 50 MG PO TABS
50.0000 mg | ORAL_TABLET | Freq: Three times a day (TID) | ORAL | 1 refills | Status: DC | PRN
  Filled 2022-06-29: qty 90, 15d supply, fill #0
  Filled 2022-07-24: qty 90, 15d supply, fill #1

## 2022-06-30 ENCOUNTER — Other Ambulatory Visit (HOSPITAL_BASED_OUTPATIENT_CLINIC_OR_DEPARTMENT_OTHER): Payer: Self-pay

## 2022-07-24 ENCOUNTER — Other Ambulatory Visit (HOSPITAL_BASED_OUTPATIENT_CLINIC_OR_DEPARTMENT_OTHER): Payer: Self-pay

## 2022-07-24 ENCOUNTER — Other Ambulatory Visit: Payer: Self-pay | Admitting: Student

## 2022-07-24 DIAGNOSIS — F900 Attention-deficit hyperactivity disorder, predominantly inattentive type: Secondary | ICD-10-CM

## 2022-07-24 MED ORDER — AMPHETAMINE-DEXTROAMPHET ER 30 MG PO CP24
ORAL_CAPSULE | ORAL | 0 refills | Status: DC
  Filled 2022-07-24 – 2022-07-31 (×2): qty 30, 30d supply, fill #0

## 2022-07-24 MED ORDER — AMPHETAMINE-DEXTROAMPHETAMINE 30 MG PO TABS
1.0000 | ORAL_TABLET | Freq: Every day | ORAL | 0 refills | Status: DC
  Filled 2022-07-24 – 2022-07-31 (×2): qty 30, 30d supply, fill #0

## 2022-07-28 ENCOUNTER — Other Ambulatory Visit (HOSPITAL_BASED_OUTPATIENT_CLINIC_OR_DEPARTMENT_OTHER): Payer: Self-pay

## 2022-07-28 ENCOUNTER — Telehealth: Payer: 59 | Admitting: Physician Assistant

## 2022-07-28 DIAGNOSIS — H109 Unspecified conjunctivitis: Secondary | ICD-10-CM

## 2022-07-28 MED ORDER — POLYMYXIN B-TRIMETHOPRIM 10000-0.1 UNIT/ML-% OP SOLN
1.0000 [drp] | OPHTHALMIC | 0 refills | Status: DC
  Filled 2022-07-28: qty 10, 34d supply, fill #0

## 2022-07-28 NOTE — Progress Notes (Signed)

## 2022-07-31 ENCOUNTER — Other Ambulatory Visit (HOSPITAL_BASED_OUTPATIENT_CLINIC_OR_DEPARTMENT_OTHER): Payer: Self-pay

## 2022-08-08 ENCOUNTER — Encounter (HOSPITAL_BASED_OUTPATIENT_CLINIC_OR_DEPARTMENT_OTHER): Payer: Self-pay

## 2022-08-08 ENCOUNTER — Other Ambulatory Visit (HOSPITAL_BASED_OUTPATIENT_CLINIC_OR_DEPARTMENT_OTHER): Payer: Self-pay

## 2022-08-31 ENCOUNTER — Other Ambulatory Visit: Payer: Self-pay | Admitting: Student

## 2022-08-31 DIAGNOSIS — F411 Generalized anxiety disorder: Secondary | ICD-10-CM

## 2022-09-05 ENCOUNTER — Other Ambulatory Visit: Payer: Self-pay | Admitting: Student

## 2022-09-05 ENCOUNTER — Other Ambulatory Visit (HOSPITAL_BASED_OUTPATIENT_CLINIC_OR_DEPARTMENT_OTHER): Payer: Self-pay

## 2022-09-05 DIAGNOSIS — F411 Generalized anxiety disorder: Secondary | ICD-10-CM

## 2022-09-05 DIAGNOSIS — F900 Attention-deficit hyperactivity disorder, predominantly inattentive type: Secondary | ICD-10-CM

## 2022-09-05 MED ORDER — HYDROXYZINE HCL 50 MG PO TABS
50.0000 mg | ORAL_TABLET | Freq: Three times a day (TID) | ORAL | 1 refills | Status: DC | PRN
  Filled 2022-09-05: qty 90, 15d supply, fill #0
  Filled 2022-10-11: qty 90, 15d supply, fill #1

## 2022-09-05 MED ORDER — AMPHETAMINE-DEXTROAMPHET ER 30 MG PO CP24
30.0000 mg | ORAL_CAPSULE | Freq: Every morning | ORAL | 0 refills | Status: DC
  Filled 2022-09-05: qty 30, 30d supply, fill #0

## 2022-09-05 MED ORDER — AMPHETAMINE-DEXTROAMPHETAMINE 30 MG PO TABS
1.0000 | ORAL_TABLET | Freq: Every day | ORAL | 0 refills | Status: DC
  Filled 2022-09-05: qty 30, 30d supply, fill #0

## 2022-09-07 ENCOUNTER — Telehealth: Payer: 59 | Admitting: Physician Assistant

## 2022-09-07 ENCOUNTER — Other Ambulatory Visit (HOSPITAL_BASED_OUTPATIENT_CLINIC_OR_DEPARTMENT_OTHER): Payer: Self-pay

## 2022-09-07 DIAGNOSIS — B356 Tinea cruris: Secondary | ICD-10-CM | POA: Diagnosis not present

## 2022-09-07 MED ORDER — CLOTRIMAZOLE-BETAMETHASONE 1-0.05 % EX CREA
1.0000 | TOPICAL_CREAM | Freq: Every day | CUTANEOUS | 0 refills | Status: DC
Start: 2022-09-07 — End: 2023-07-25
  Filled 2022-09-07: qty 30, 30d supply, fill #0

## 2022-09-07 NOTE — Progress Notes (Signed)
E-Visit for [employer]  We are sorry that you are not feeling well. Here is how we plan to help!  Based on what you shared with me it looks like you have tinea cruris, or "Jock Itch".  The symptoms of Jock Itch include red, peeling, itchy rash that affects the groin (crease where the leg meets the trunk).  This fungal infection can be spread through shared towels, clothing, bedding, or hard surfaces (particularly in moist areas) such as shower stalls, locker room floors, or pool area that has the fungus present. If you have a fungal infection on one part of your body, you can also spread it to other parts. For instance, men with a fungal infection on their feet sometimes spread it to their groin.  I am prescribing Lotisone cream to apply twice daily for up to 10-14 days.   HOME CARE:  Keep affected area clean, dry, and cool. Wash with soap and shampoo after sports or exercise and dry yourself well after bathing or swimming Wear cotton underwear and change them if they become damp or sweaty. Avoid using swimming pools, public showers, or baths.  GET HELP RIGHT AWAY IF:  Symptoms that don't away after treatment. Severe itching that persists. If your rash spreads or swells. If your rash begins to have drainage or smell. You develop a fever.  MAKE SURE YOU   Understand these instructions. Will watch your condition. Will get help right away if you are not doing well or get worse.  Thank you for choosing an e-visit.  Your e-visit answers were reviewed by a board certified advanced clinical practitioner to complete your personal care plan. Depending upon the condition, your plan could have included both over the counter or prescription medications.  Please review your pharmacy choice. Make sure the pharmacy is open so you can pick up prescription now. If there is a problem, you may contact your provider through [employer] and have the prescription routed to another pharmacy.  Your  safety is important to [place]. If you have drug allergies check your prescription carefully.   For the next 24 hours you can use MyChart to ask questions about today's visit, request a non-urgent call back, or ask for a work or school excuse. You will get an email in the next two days asking about your experience. I hope that your e-visit has been valuable and will speed your recovery.   References or for more information:  Social[employer]ulfillment[employer]hu [email or web address] [email or web address]=search_[email or web address]=[email or web address]~52&usage_type=default&display_rank=3

## 2022-09-07 NOTE — Progress Notes (Signed)
I have spent 5 minutes in review of e-visit questionnaire, review and updating patient chart, medical decision making and response to patient.   Husam Hohn Cody Desa Rech, PA-C    

## 2022-09-26 ENCOUNTER — Telehealth: Payer: 59 | Admitting: Physician Assistant

## 2022-09-26 ENCOUNTER — Ambulatory Visit: Admit: 2022-09-26 | Discharge status: Absent without leave | Payer: 59

## 2022-09-26 ENCOUNTER — Ambulatory Visit
Admission: EM | Admit: 2022-09-26 | Discharge: 2022-09-26 | Disposition: A | Discharge status: Home / Self Care | Payer: 59 | Attending: Physician Assistant | Admitting: Physician Assistant

## 2022-09-26 DIAGNOSIS — Z1152 Encounter for screening for COVID-19: Secondary | ICD-10-CM | POA: Insufficient documentation

## 2022-09-26 DIAGNOSIS — B349 Viral infection, unspecified: Secondary | ICD-10-CM | POA: Insufficient documentation

## 2022-09-26 DIAGNOSIS — R52 Pain, unspecified: Secondary | ICD-10-CM

## 2022-09-26 DIAGNOSIS — J029 Acute pharyngitis, unspecified: Secondary | ICD-10-CM

## 2022-09-26 LAB — POCT RAPID STREP A (OFFICE): Rapid Strep A Screen: NEGATIVE

## 2022-09-26 NOTE — ED Provider Notes (Signed)
[facility]    CSN: [id number] Arrival date & time: 09/26/22  0904      History   Chief Complaint Chief Complaint  Patient presents with   Generalized Body Aches   Headache   Chills    HPI Danny Edwards is a 36 y.o. male.   Patient complains of a cough and congestion he took a home COVID test yesterday that was negative.  Patient complains of pain in his throat.  Patient denies fever or chills denies any shortness of breath  The history is provided by the patient. No language interpreter was used.  Headache   Past Medical History:  Diagnosis Date   ADHD     Patient Active Problem List   Diagnosis Date Noted   Left ear pain 02/16/2021   Low testosterone in male 07/01/2020   Mixed hyperlipidemia 06/13/2020   Anxiety state 04/21/2016   ADHD, predominantly inattentive type 01/25/2016   Tobacco use disorder 01/25/2016    Past Surgical History:  Procedure Laterality Date   KNEE SURGERY Left 2008   ACL tear       Home Medications    Prior to Admission medications   Medication Sig Start Date End Date Taking? Authorizing Provider  amphetamine-dextroamphetamine (ADDERALL XR) 30 MG 24 hr capsule Take 1 capsule (30 mg total) by mouth every morning. 09/05/22   Holley Bouche, MD  amphetamine-dextroamphetamine (ADDERALL) 30 MG tablet Take 1 tablet by mouth daily. 09/05/22   Holley Bouche, MD  clotrimazole-betamethasone (LOTRISONE) cream Apply to affected area(s) daily as directed 09/07/22   Brunetta Jeans, PA-C  fexofenadine [facility] ALLERGY) 180 MG tablet Take 1 tablet (180 mg total) by mouth daily. 09/20/21 10/20/21  Eliezer Lofts, FNP  hydrOXYzine (ATARAX) 50 MG tablet Take 1-2 tablets (50-100 mg total) by mouth 3 (three) times daily as needed for anxiety or insomnia. 09/05/22   Holley Bouche, MD  influenza vac split quadrivalent PF (FLUARIX) 0.5 ML injection Inject into the muscle. 10/13/21   Carlyle Basques, MD  methadone (DOLOPHINE) 10 MG tablet  Take 74 mg by mouth daily.    [provider]  trimethoprim-polymyxin b (POLYTRIM) ophthalmic solution Place 1 drop into the left eye every 4 (four) hours for 5 days 07/28/22   Mar Daring, PA-C    Family History Family History  Problem Relation Age of Onset   Thyroid disease Mother    Healthy Father    Healthy Sister    Healthy Brother    Healthy Brother     Social History Social History   Tobacco Use   Smoking status: Every Day    Packs/day: 1.00    Years: 9.00    Total pack years: 9.00    Types: Cigarettes   Smokeless tobacco: Former    Types: Nurse, children's Use: Never used  Substance Use Topics   Alcohol use: Yes    Alcohol/week: 1.0 standard drink of alcohol    Types: 1 Cans of beer per week   Drug use: No     Allergies   Patient has no known allergies.   Review of Systems Review of Systems  Neurological:  Positive for headaches.  All other systems reviewed and are negative.    Physical Exam Triage Vital Signs ED Triage Vitals [09/26/22 0913]  Enc Vitals Group     BP 118/75     Pulse Rate 83     Resp 18     Temp 97.9 F (36.6  C)     Temp Source Oral     SpO2 98 %     Weight      Height      Head Circumference      Peak Flow      Pain Score 0     Pain Loc      Pain Edu?      Excl. in [place]?    No data found.  Updated Vital Signs BP 118/75 (BP Location: Right Arm)   Pulse 83   Temp 97.9 F (36.6 C) (Oral)   Resp 18   SpO2 98%   Visual Acuity Right Eye Distance:   Left Eye Distance:   Bilateral Distance:    Right Eye Near:   Left Eye Near:    Bilateral Near:     Physical Exam Vitals and nursing note reviewed.  Constitutional:      Appearance: He is well-developed.  HENT:     Head: Normocephalic.  Pulmonary:     Effort: Pulmonary effort is normal.  Abdominal:     General: There is no distension.  Musculoskeletal:        General: Normal range of motion.     Cervical back: Normal range of motion.   Neurological:     Mental Status: He is alert and oriented to person, place, and time.  Psychiatric:        Mood and Affect: Mood normal.      UC Treatments / Results  Labs (all labs ordered are listed, but only abnormal results are displayed) Labs Reviewed  SARS CORONAVIRUS 2 (TAT 6-24 HRS)  POCT RAPID STREP A (OFFICE)    EKG   Radiology No results found.  Procedures Procedures (including critical care time)  Medications Ordered in UC Medications - No data to display  Initial Impression / Assessment and Plan / UC Course  I have reviewed the triage vital signs and the nursing notes.  Pertinent labs & imaging results that were available during my care of the patient were reviewed by me and considered in my medical decision making (see chart for details).     MDM: Patient strep screen is negative COVID test is pending I counseled patient on viral illness.  Patient counseled on symptomatic care Final Clinical Impressions(s) / UC Diagnoses   Final diagnoses:  Viral illness     Discharge Instructions      Your covid test is pending    ED Prescriptions   None    PDMP not reviewed this encounter.   Fransico Meadow, [place] 09/26/22 1055

## 2022-09-26 NOTE — Discharge Instructions (Signed)
Your covid test is pending 

## 2022-09-26 NOTE — Progress Notes (Signed)
Because of body aches and sore throat, giving time of year, I would honestly recommend being seen in person either with your PCP or at a local urgent care for strep testing and a flu swab to make sure you get treated properly.   NOTE: You will NOT be charged for this eVisit.  If you do not have a PCP, [facility] offers a free physician referral service available at [phone]. Our trained staff has the experience, knowledge and resources to put you in touch with a physician who is right for you.    If you are having a true medical emergency please call 911.   Your e-visit answers were reviewed by a board certified advanced clinical practitioner to complete your personal care plan.  Thank you for using e-Visits.

## 2022-09-26 NOTE — ED Triage Notes (Signed)
Pt c/o bodyaches, chills and headache. Also had sore throat but that resolved overnight. No known covid exposure. Neg covid at home yesterday. Tylenol and advil prn.

## 2022-09-27 ENCOUNTER — Telehealth: Payer: Self-pay | Admitting: Emergency Medicine

## 2022-09-27 LAB — SARS CORONAVIRUS 2 (TAT 6-24 HRS): SARS Coronavirus 2: NEGATIVE

## 2022-09-27 NOTE — Telephone Encounter (Signed)
Patient states that he's feeling about the same, all of his tests were negative.  Patient will continue with supportive care and follow up as needed.

## 2022-10-11 ENCOUNTER — Other Ambulatory Visit: Payer: Self-pay | Admitting: Student

## 2022-10-11 ENCOUNTER — Other Ambulatory Visit (HOSPITAL_BASED_OUTPATIENT_CLINIC_OR_DEPARTMENT_OTHER): Payer: Self-pay

## 2022-10-11 DIAGNOSIS — F900 Attention-deficit hyperactivity disorder, predominantly inattentive type: Secondary | ICD-10-CM

## 2022-10-11 MED ORDER — AMPHETAMINE-DEXTROAMPHET ER 30 MG PO CP24
30.0000 mg | ORAL_CAPSULE | Freq: Every morning | ORAL | 0 refills | Status: DC
  Filled 2022-10-11: qty 30, 30d supply, fill #0

## 2022-10-11 MED ORDER — AMPHETAMINE-DEXTROAMPHETAMINE 30 MG PO TABS
1.0000 | ORAL_TABLET | Freq: Every day | ORAL | 0 refills | Status: DC
  Filled 2022-10-11: qty 30, 30d supply, fill #0

## 2022-11-14 ENCOUNTER — Other Ambulatory Visit: Payer: Self-pay | Admitting: Student

## 2022-11-14 ENCOUNTER — Other Ambulatory Visit (HOSPITAL_BASED_OUTPATIENT_CLINIC_OR_DEPARTMENT_OTHER): Payer: Self-pay

## 2022-11-14 DIAGNOSIS — F900 Attention-deficit hyperactivity disorder, predominantly inattentive type: Secondary | ICD-10-CM

## 2022-11-14 DIAGNOSIS — F411 Generalized anxiety disorder: Secondary | ICD-10-CM

## 2022-11-14 MED ORDER — HYDROXYZINE HCL 50 MG PO TABS
50.0000 mg | ORAL_TABLET | Freq: Three times a day (TID) | ORAL | 1 refills | Status: DC | PRN
  Filled 2022-11-14: qty 90, 15d supply, fill #0
  Filled 2022-12-28: qty 90, 15d supply, fill #1

## 2022-11-14 MED ORDER — AMPHETAMINE-DEXTROAMPHETAMINE 30 MG PO TABS
1.0000 | ORAL_TABLET | Freq: Every day | ORAL | 0 refills | Status: DC
  Filled 2022-11-14: qty 30, 30d supply, fill #0

## 2022-11-14 MED ORDER — AMPHETAMINE-DEXTROAMPHET ER 30 MG PO CP24
30.0000 mg | ORAL_CAPSULE | Freq: Every morning | ORAL | 0 refills | Status: DC
  Filled 2022-11-14: qty 30, 30d supply, fill #0

## 2022-11-15 ENCOUNTER — Other Ambulatory Visit (HOSPITAL_BASED_OUTPATIENT_CLINIC_OR_DEPARTMENT_OTHER): Payer: Self-pay

## 2022-11-29 ENCOUNTER — Ambulatory Visit: Payer: 59 | Admitting: Student

## 2022-11-29 ENCOUNTER — Encounter: Payer: Self-pay | Admitting: Student

## 2022-11-29 VITALS — BP 112/70 | HR 101 | Temp 98.1°F | Ht 69.0 in | Wt 219.6 lb

## 2022-11-29 DIAGNOSIS — R1032 Left lower quadrant pain: Secondary | ICD-10-CM | POA: Diagnosis not present

## 2022-11-29 DIAGNOSIS — R7989 Other specified abnormal findings of blood chemistry: Secondary | ICD-10-CM

## 2022-11-29 DIAGNOSIS — F1111 Opioid abuse, in remission: Secondary | ICD-10-CM | POA: Diagnosis not present

## 2022-11-29 DIAGNOSIS — F900 Attention-deficit hyperactivity disorder, predominantly inattentive type: Secondary | ICD-10-CM | POA: Diagnosis not present

## 2022-11-29 DIAGNOSIS — G4730 Sleep apnea, unspecified: Secondary | ICD-10-CM | POA: Insufficient documentation

## 2022-11-29 DIAGNOSIS — Z23 Encounter for immunization: Secondary | ICD-10-CM

## 2022-11-29 DIAGNOSIS — E782 Mixed hyperlipidemia: Secondary | ICD-10-CM | POA: Diagnosis not present

## 2022-11-29 NOTE — Assessment & Plan Note (Signed)
Patient reports he is doing well with medication, taking XR in am, and IR in evening. He denies any issues with sleep, appetite or weight loss. Patient had drug testing last month and will bring report to clinic. -UDS 1-2 a year -Last drug screen to be brought to clinic -Continue Adderall 30 mg XR & IR -Consider switching Adderall 30 mg IR for methylphenidate 10-20 mg in future

## 2022-11-29 NOTE — Patient Instructions (Signed)
It was great to see you! Thank you for allowing me to participate in your care!  Our plans for today:  - Low Testosterone  Testing today  - Groin tenderness  Not concerning at this time, everything looks normal. May have strained your groin area  Seek medical care if:    Groin pain, intense, not responding to pain meds with: redness/swelling/paleness of testes    Groin pain with nausea/vomiting   Groin pain with fever  - Sleep Apnea  Referral for sleep study, I will tell the team you prefer an at home study  - ADHD  Bring last urine testing to clinic  We will test you 1-2 time a year from then on  - High Cholesterol  Checking your cholesterol today  Vaccine:  Tdap  We are checking some labs today, I will call you if they are abnormal will send you a MyChart message or a letter if they are normal.  If you do not hear about your labs in the next 2 weeks please let us know.  Take care and seek immediate care sooner if you develop any concerns.   Dr. Bess Kinds, MD [facility]

## 2022-11-29 NOTE — Assessment & Plan Note (Signed)
Patient complaining of intermittent groin pain, f/u injury to testicles while playing baseball. Patient denied any swelling, but appreciated tenderness with initial trauma that has since resolved. Patient appreciates tenderness when dog passes between legs. Patient has normal groin exam and no TTP. Patient likely stressed groin and is having tenderness intermittently for this. No concern for torsion or epididymitis at this time. -Continue to monitor -Return precautions for torsion/epididymitis

## 2022-11-29 NOTE — Assessment & Plan Note (Signed)
Patient was previously on methadone but has since transitioned off of it. Report's he's doing well and still has f/u with a counselor.

## 2022-11-29 NOTE — Assessment & Plan Note (Signed)
Patient report's he's been trying to work on his lipids with diet and exercise. Had previously been elevated, Lab Results  Component Value Date   CHOL 196 12/30/2021   HDL 39 (L) 12/30/2021   LDLCALC 133 (H) 12/30/2021   TRIG 130 12/30/2021   CHOLHDL 5.0 12/30/2021  -lipid panel

## 2022-11-29 NOTE — Assessment & Plan Note (Signed)
Patient wife noting that patient is snoring in sleep and waking himself up multiple times during the night. Patient unaware and reports feeling tired in the am. Patient is open to sleep study, but would prefer to do in home sleep study.  -Amb Ref Sleep

## 2022-11-29 NOTE — Progress Notes (Signed)
SUBJECTIVE:   Chief compliant/HPI: annual examination  Danny Edwards is a 36 y.o. who presents today for an annual exam.   Groin tenderness: Hit in the cup with baseball 3 months ago, and hit left testicle a little bit, and was tender. Two weeks ago dog went between legs and hurt his groin area. No sexual or urinary problems.   Low Testosterone Family hx of low testosterone w/ low Testosterone on labs in 06/25/2020, patient was complaining of fatigue, that eventually improved and further work up was declined.  Report's feeling some fatigue, but is unsure if it's related to sleep.  Sleep apnea Wife says he snores and wakes himself up multiple times a night. Has tried a mouthpiece and felt better in the am, but later on had jaw soar ness. Waking up feeling tired, and is tired during the day. Not falling asleep during day or taking naps.   Opoid Abuse Methadone: has been off for about a month. He feels well and doesn't want to go back but note's it was tough at first. Has a counselor that calls from time to time.    ADHD Denies difficulty with sleep, appetite, or weight loss. Patient receives monthly drug tests at [facility] [place], and will forward to [facility].  Patient taking 30 mg XR in am and 30 mg IR in evening.  HLD Lab Results  Component Value Date   CHOL 196 12/30/2021   HDL 39 (L) 12/30/2021   LDLCALC 133 (H) 12/30/2021   TRIG 130 12/30/2021   CHOLHDL 5.0 12/30/2021    Tobacco use Has been off smoking for about a month, and has been taking [employer] free Nicotine supplement packs to help with cessation  OBJECTIVE:   BP 112/70   Pulse (!) 101   Temp 98.1 F (36.7 C)   Ht 5\' 9"  (1.753 m)   Wt 219 lb 9.6 oz (99.6 kg)   SpO2 99%   BMI 32.43 kg/m    Physical Exam Exam conducted with a chaperone present.  Constitutional:      General: He is not in acute distress.    Appearance: Normal appearance. He is not ill-appearing.  Cardiovascular:      Rate and Rhythm: Normal rate and regular rhythm.     Pulses: Normal pulses.     Heart sounds: Normal heart sounds. No murmur heard.    No friction rub. No gallop.  Pulmonary:     Effort: Pulmonary effort is normal. No respiratory distress.     Breath sounds: Normal breath sounds. No stridor. No wheezing or rhonchi.  Abdominal:     General: Bowel sounds are normal. There is no distension.     Palpations: Abdomen is soft. There is no mass.     Tenderness: There is no abdominal tenderness. There is no guarding.     Hernia: No hernia is present. There is no hernia in the left inguinal area or right inguinal area.  Genitourinary:    Pubic Area: No rash.      Penis: Normal and circumcised. No erythema, tenderness, discharge, swelling or lesions.      Testes:        Right: Mass, tenderness or swelling not present.        Left: Mass, tenderness or swelling not present.     Epididymis:     Right: Normal. Not inflamed or enlarged. No mass or tenderness.     Left: Normal. Not inflamed or enlarged. No mass  or tenderness.  Lymphadenopathy:     Lower Body: No right inguinal adenopathy. No left inguinal adenopathy.  Neurological:     Mental Status: He is alert.  Psychiatric:        Mood and Affect: Mood normal.        Behavior: Behavior normal.        Thought Content: Thought content normal.       ASSESSMENT/PLAN:   Groin pain, left Patient complaining of intermittent groin pain, f/u injury to testicles while playing baseball. Patient denied any swelling, but appreciated tenderness with initial trauma that has since resolved. Patient appreciates tenderness when dog passes between legs. Patient has normal groin exam and no TTP. Patient likely stressed groin and is having tenderness intermittently for this. No concern for torsion or epididymitis at this time. -Continue to monitor -Return precautions for torsion/epididymitis   Sleep apnea Patient wife noting that patient is snoring in  sleep and waking himself up multiple times during the night. Patient unaware and reports feeling tired in the am. Patient is open to sleep study, but would prefer to do in home sleep study.  -Amb Ref Sleep  Low testosterone in male Patient notes some fatigue more recently and would like his tesotserone retested. Last checked 2021 and was low, but patient elected to hold off on further testing. -Testosterone lvl -Consider Amb Ref to Urology for low tes  Opioid abuse, in remission [facility]) Patient was previously on methadone but has since transitioned off of it. Report's he's doing well and still has f/u with a counselor.   ADHD, predominantly inattentive type Patient reports he is doing well with medication, taking XR in am, and IR in evening. He denies any issues with sleep, appetite or weight loss. Patient had drug testing last month and will bring report to clinic. -UDS 1-2 a year -Last drug screen to be brought to clinic -Continue Adderall 30 mg XR & IR -Consider switching Adderall 30 mg IR for methylphenidate 10-20 mg in future  Mixed hyperlipidemia Patient report's he's been trying to work on his lipids with diet and exercise. Had previously been elevated, Lab Results  Component Value Date   CHOL 196 12/30/2021   HDL 39 (L) 12/30/2021   LDLCALC 133 (H) 12/30/2021   TRIG 130 12/30/2021   CHOLHDL 5.0 12/30/2021  -lipid panel    Annual Examination  See AVS for age appropriate recommendations  Blood pressure reviewed and at goal.     Considered the following items based upon USPSTF recommendations: HIV testing: declined, patient monogamous with wife Hepatitis C: not indicated Neg 2021 Hepatitis B: not indicated Neg 2021 Syphilis if at high risk: {declined, patient monogamous with wife GC/CTdeclined, patient monogamous with wife Lipid panel (nonfasting or fasting) discussed based upon AHA recommendations and ordered.  Consider repeat every 4-6 years.  Immunizations TDAP, Flu    Follow up in 1 year or sooner if indicated.    Holley Bouche, MD [place]

## 2022-11-29 NOTE — Assessment & Plan Note (Signed)
Patient notes some fatigue more recently and would like his tesotserone retested. Last checked 2021 and was low, but patient elected to hold off on further testing. -Testosterone lvl -Consider Amb Ref to Urology for low tes

## 2022-11-30 LAB — LIPID PANEL
Chol/HDL Ratio: 4.3 ratio (ref 0.0–5.0)
Cholesterol, Total: 225 mg/dL — ABNORMAL HIGH (ref 100–199)
HDL: 52 mg/dL (ref >39)
LDL Chol Calc (NIH): 161 mg/dL — ABNORMAL HIGH (ref 0–99)
Triglycerides: 67 mg/dL (ref 0–149)
VLDL Cholesterol Cal: 12 mg/dL (ref 5–40)

## 2022-12-01 ENCOUNTER — Telehealth: Payer: Self-pay | Admitting: Student

## 2022-12-01 NOTE — Telephone Encounter (Signed)
Called to inform patient that his cholesterol panel was elevated, and higher from last year. LDL (bad cholesterol) was 250 this year, previously 133. Total Cholesterol is 255, previuosly 196.  Want to encourage patient to continue good diet and exercise. Patient can consider starting statin if he'd like, but not necessary at this time.   ASCVD risk calculator puts risk at 0.5% in next 10 years, thus patient does not need statin, but is good idea to consider.

## 2022-12-02 LAB — TESTOSTERONE, FREE, TOTAL, SHBG
Sex Hormone Binding: 28.4 nmol/L (ref 16.5–55.9)
Testosterone, Free: 9.9 pg/mL (ref 8.7–25.1)
Testosterone: 489 ng/dL (ref 264–916)

## 2022-12-28 ENCOUNTER — Other Ambulatory Visit: Payer: Self-pay | Admitting: Student

## 2022-12-28 ENCOUNTER — Encounter: Payer: Self-pay | Admitting: Student

## 2022-12-28 DIAGNOSIS — F900 Attention-deficit hyperactivity disorder, predominantly inattentive type: Secondary | ICD-10-CM

## 2022-12-29 ENCOUNTER — Other Ambulatory Visit: Payer: Self-pay

## 2022-12-30 MED ORDER — AMPHETAMINE-DEXTROAMPHET ER 30 MG PO CP24
30.0000 mg | ORAL_CAPSULE | Freq: Every morning | ORAL | 0 refills | Status: DC
  Filled 2022-12-30: qty 30, 30d supply, fill #0

## 2022-12-30 MED ORDER — AMPHETAMINE-DEXTROAMPHETAMINE 30 MG PO TABS
1.0000 | ORAL_TABLET | Freq: Every day | ORAL | 0 refills | Status: DC
  Filled 2022-12-30: qty 30, 30d supply, fill #0

## 2023-01-01 ENCOUNTER — Other Ambulatory Visit (HOSPITAL_BASED_OUTPATIENT_CLINIC_OR_DEPARTMENT_OTHER): Payer: Self-pay

## 2023-01-19 ENCOUNTER — Encounter: Payer: Self-pay | Admitting: Student

## 2023-02-13 ENCOUNTER — Other Ambulatory Visit: Payer: Self-pay | Admitting: Student

## 2023-02-13 DIAGNOSIS — F411 Generalized anxiety disorder: Secondary | ICD-10-CM

## 2023-02-13 DIAGNOSIS — F900 Attention-deficit hyperactivity disorder, predominantly inattentive type: Secondary | ICD-10-CM

## 2023-02-14 ENCOUNTER — Other Ambulatory Visit: Payer: Self-pay | Admitting: Student

## 2023-02-14 ENCOUNTER — Other Ambulatory Visit (HOSPITAL_BASED_OUTPATIENT_CLINIC_OR_DEPARTMENT_OTHER): Payer: Self-pay

## 2023-02-14 DIAGNOSIS — F411 Generalized anxiety disorder: Secondary | ICD-10-CM

## 2023-02-14 MED ORDER — AMPHETAMINE-DEXTROAMPHET ER 30 MG PO CP24
30.0000 mg | ORAL_CAPSULE | Freq: Every morning | ORAL | 0 refills | Status: DC
  Filled 2023-02-14: qty 30, 30d supply, fill #0

## 2023-02-14 MED ORDER — AMPHETAMINE-DEXTROAMPHETAMINE 30 MG PO TABS
1.0000 | ORAL_TABLET | Freq: Every day | ORAL | 0 refills | Status: DC
  Filled 2023-02-14 (×2): qty 30, 30d supply, fill #0

## 2023-02-19 ENCOUNTER — Other Ambulatory Visit (HOSPITAL_BASED_OUTPATIENT_CLINIC_OR_DEPARTMENT_OTHER): Payer: Self-pay

## 2023-02-19 ENCOUNTER — Encounter: Payer: Self-pay | Admitting: Student

## 2023-02-20 ENCOUNTER — Encounter: Payer: Self-pay | Admitting: Student

## 2023-02-21 ENCOUNTER — Other Ambulatory Visit (HOSPITAL_BASED_OUTPATIENT_CLINIC_OR_DEPARTMENT_OTHER): Payer: Self-pay

## 2023-02-21 MED ORDER — HYDROXYZINE HCL 50 MG PO TABS
50.0000 mg | ORAL_TABLET | Freq: Three times a day (TID) | ORAL | 0 refills | Status: DC | PRN
  Filled 2023-02-21: qty 90, 15d supply, fill #0

## 2023-03-22 ENCOUNTER — Other Ambulatory Visit: Payer: Self-pay | Admitting: Student

## 2023-03-22 DIAGNOSIS — F900 Attention-deficit hyperactivity disorder, predominantly inattentive type: Secondary | ICD-10-CM

## 2023-03-22 DIAGNOSIS — F411 Generalized anxiety disorder: Secondary | ICD-10-CM

## 2023-03-22 MED ORDER — AMPHETAMINE-DEXTROAMPHET ER 30 MG PO CP24
30.0000 mg | ORAL_CAPSULE | Freq: Every morning | ORAL | 0 refills | Status: DC
  Filled 2023-03-22: qty 30, 30d supply, fill #0

## 2023-03-22 MED ORDER — HYDROXYZINE HCL 50 MG PO TABS
50.0000 mg | ORAL_TABLET | Freq: Three times a day (TID) | ORAL | 0 refills | Status: DC | PRN
  Filled 2023-03-22: qty 90, 15d supply, fill #0

## 2023-03-22 MED ORDER — AMPHETAMINE-DEXTROAMPHETAMINE 30 MG PO TABS
1.0000 | ORAL_TABLET | Freq: Every day | ORAL | 0 refills | Status: DC
  Filled 2023-03-22: qty 30, 30d supply, fill #0

## 2023-03-23 ENCOUNTER — Other Ambulatory Visit (HOSPITAL_BASED_OUTPATIENT_CLINIC_OR_DEPARTMENT_OTHER): Payer: Self-pay

## 2023-05-09 ENCOUNTER — Other Ambulatory Visit: Payer: Self-pay | Admitting: Student

## 2023-05-09 DIAGNOSIS — F411 Generalized anxiety disorder: Secondary | ICD-10-CM

## 2023-05-09 DIAGNOSIS — F900 Attention-deficit hyperactivity disorder, predominantly inattentive type: Secondary | ICD-10-CM

## 2023-05-11 ENCOUNTER — Other Ambulatory Visit (HOSPITAL_BASED_OUTPATIENT_CLINIC_OR_DEPARTMENT_OTHER): Payer: Self-pay

## 2023-05-11 MED ORDER — HYDROXYZINE HCL 50 MG PO TABS
50.0000 mg | ORAL_TABLET | Freq: Three times a day (TID) | ORAL | 0 refills | Status: DC | PRN
  Filled 2023-05-11: qty 90, 15d supply, fill #0

## 2023-05-11 MED ORDER — AMPHETAMINE-DEXTROAMPHETAMINE 30 MG PO TABS
1.0000 | ORAL_TABLET | Freq: Every day | ORAL | 0 refills | Status: DC
  Filled 2023-05-11: qty 30, 30d supply, fill #0

## 2023-05-11 MED ORDER — AMPHETAMINE-DEXTROAMPHET ER 30 MG PO CP24
30.0000 mg | ORAL_CAPSULE | Freq: Every morning | ORAL | 0 refills | Status: DC
  Filled 2023-05-11: qty 30, 30d supply, fill #0

## 2023-05-15 ENCOUNTER — Other Ambulatory Visit (HOSPITAL_BASED_OUTPATIENT_CLINIC_OR_DEPARTMENT_OTHER): Payer: Self-pay

## 2023-06-11 ENCOUNTER — Other Ambulatory Visit: Payer: Self-pay | Admitting: Student

## 2023-06-11 DIAGNOSIS — F900 Attention-deficit hyperactivity disorder, predominantly inattentive type: Secondary | ICD-10-CM

## 2023-06-11 DIAGNOSIS — F411 Generalized anxiety disorder: Secondary | ICD-10-CM

## 2023-06-12 ENCOUNTER — Other Ambulatory Visit: Payer: Self-pay

## 2023-06-12 ENCOUNTER — Other Ambulatory Visit (HOSPITAL_BASED_OUTPATIENT_CLINIC_OR_DEPARTMENT_OTHER): Payer: Self-pay

## 2023-06-12 MED ORDER — AMPHETAMINE-DEXTROAMPHET ER 30 MG PO CP24
30.0000 mg | ORAL_CAPSULE | Freq: Every morning | ORAL | 0 refills | Status: DC
  Filled 2023-06-12: qty 30, 30d supply, fill #0

## 2023-06-12 MED ORDER — HYDROXYZINE HCL 50 MG PO TABS
50.0000 mg | ORAL_TABLET | Freq: Three times a day (TID) | ORAL | 0 refills | Status: DC | PRN
  Filled 2023-06-12: qty 90, 15d supply, fill #0

## 2023-06-12 MED ORDER — AMPHETAMINE-DEXTROAMPHETAMINE 30 MG PO TABS
1.0000 | ORAL_TABLET | Freq: Every day | ORAL | 0 refills | Status: DC
  Filled 2023-06-12: qty 30, 30d supply, fill #0

## 2023-06-26 ENCOUNTER — Ambulatory Visit: Payer: Commercial Managed Care - PPO | Admitting: Urology

## 2023-06-26 ENCOUNTER — Encounter: Payer: Self-pay | Admitting: Urology

## 2023-06-26 ENCOUNTER — Other Ambulatory Visit (HOSPITAL_BASED_OUTPATIENT_CLINIC_OR_DEPARTMENT_OTHER): Payer: Self-pay

## 2023-06-26 VITALS — BP 129/82 | HR 88 | Ht 69.0 in | Wt 207.0 lb

## 2023-06-26 DIAGNOSIS — N451 Epididymitis: Secondary | ICD-10-CM | POA: Diagnosis not present

## 2023-06-26 DIAGNOSIS — N5082 Scrotal pain: Secondary | ICD-10-CM

## 2023-06-26 MED ORDER — MELOXICAM 7.5 MG PO TABS
7.5000 mg | ORAL_TABLET | Freq: Every day | ORAL | 0 refills | Status: AC
  Filled 2023-06-26: qty 10, 10d supply, fill #0

## 2023-06-26 MED ORDER — DOXYCYCLINE HYCLATE 100 MG PO CAPS
100.0000 mg | ORAL_CAPSULE | Freq: Two times a day (BID) | ORAL | 0 refills | Status: AC
  Filled 2023-06-26: qty 20, 10d supply, fill #0

## 2023-06-26 NOTE — Progress Notes (Signed)
Assessment: 1. Scrotal pain   2. Epididymitis, left     Plan: Diagnosis and management of epididymitis discussed with the patient. Recommend treatment with doxycycline x 10 days and nonsteroidal anti-inflammatory medication. Return to office in approximately 3 weeks for reevaluation.  Chief Complaint:  Chief Complaint  Patient presents with   Testicle Pain    History of Present Illness:  Danny Edwards is a 37 y.o. male who is seen for evaluation of left scrotal pain. He reports being hit with a baseball in the scrotal area approximately 1 year ago.  He had some discomfort following this event which resolved.  Approximately 1 month ago, he was again with a baseball in the left scrotal area although he was wearing a cup.  He experienced some discomfort in the left scrotum.  This has been intermittent in nature.  No scrotal swelling or redness.  He has not felt a mass.  He reports discomfort on the left side with certain positions with radiation into the left inguinal area.  No lower urinary tract symptoms.   Past Medical History:  Past Medical History:  Diagnosis Date   ADHD     Past Surgical History:  Past Surgical History:  Procedure Laterality Date   KNEE SURGERY Left 2008   ACL tear    Allergies:  No Known Allergies  Family History:  Family History  Problem Relation Age of Onset   Thyroid disease Mother    Healthy Father    Healthy Sister    Healthy Brother    Healthy Brother     Social History:  Social History   Tobacco Use   Smoking status: Every Day    Packs/day: 1.00    Years: 9.00    Additional pack years: 0.00    Total pack years: 9.00    Types: Cigarettes   Smokeless tobacco: Former    Types: Associate Professor Use: Never used  Substance Use Topics   Alcohol use: Yes    Alcohol/week: 1.0 standard drink of alcohol    Types: 1 Cans of beer per week   Drug use: No    Review of symptoms:  Constitutional:  Negative for unexplained  weight loss, night sweats, fever, chills ENT:  Negative for nose bleeds, sinus pain, painful swallowing CV:  Negative for chest pain, shortness of breath, exercise intolerance, palpitations, loss of consciousness Resp:  Negative for cough, wheezing, shortness of breath GI:  Negative for nausea, vomiting, diarrhea, bloody stools GU:  Positives noted in HPI; otherwise negative for gross hematuria, dysuria, urinary incontinence Neuro:  Negative for seizures, poor balance, limb weakness, slurred speech Psych:  Negative for lack of energy, depression, anxiety Endocrine:  Negative for polydipsia, polyuria, symptoms of hypoglycemia (dizziness, hunger, sweating) Hematologic:  Negative for anemia, purpura, petechia, prolonged or excessive bleeding, use of anticoagulants  Allergic:  Negative for difficulty breathing or choking as a result of exposure to anything; no shellfish allergy; no allergic response (rash/itch) to materials, foods  Physical exam: BP 129/82   Pulse 88   Ht 5\' 9"  (1.753 m)   Wt 207 lb (93.9 kg)   BMI 30.57 kg/m  GENERAL APPEARANCE:  Well appearing, well developed, well nourished, NAD HEENT:  Atraumatic, normocephalic, oropharynx clear NECK:  Supple without lymphadenopathy or thyromegaly ABDOMEN:  Soft, non-tender, no masses EXTREMITIES:  Moves all extremities well, without clubbing, cyanosis, or edema NEUROLOGIC:  Alert and oriented x 3, normal gait, CN II-XII grossly intact MENTAL STATUS:  appropriate BACK:  Non-tender to palpation, No CVAT SKIN:  Warm, dry, and intact GU: Penis:  circumcised Meatus: Normal Scrotum: No erythema or edema; no hernia palpated; vas palpated bilaterally Testis: normal without masses bilateral Epididymis: tenderness: left  Results: None

## 2023-07-11 ENCOUNTER — Other Ambulatory Visit (HOSPITAL_BASED_OUTPATIENT_CLINIC_OR_DEPARTMENT_OTHER): Payer: Self-pay

## 2023-07-12 ENCOUNTER — Other Ambulatory Visit: Payer: Self-pay | Admitting: Student

## 2023-07-12 DIAGNOSIS — F411 Generalized anxiety disorder: Secondary | ICD-10-CM

## 2023-07-12 DIAGNOSIS — F900 Attention-deficit hyperactivity disorder, predominantly inattentive type: Secondary | ICD-10-CM

## 2023-07-13 MED ORDER — HYDROXYZINE HCL 50 MG PO TABS
50.0000 mg | ORAL_TABLET | Freq: Three times a day (TID) | ORAL | 0 refills | Status: DC | PRN
Start: 2023-07-13 — End: 2023-08-16
  Filled 2023-07-13: qty 90, 15d supply, fill #0

## 2023-07-13 MED ORDER — AMPHETAMINE-DEXTROAMPHETAMINE 30 MG PO TABS
1.0000 | ORAL_TABLET | Freq: Every day | ORAL | 0 refills | Status: DC
  Filled 2023-07-13: qty 30, 30d supply, fill #0

## 2023-07-13 MED ORDER — AMPHETAMINE-DEXTROAMPHET ER 30 MG PO CP24
30.0000 mg | ORAL_CAPSULE | Freq: Every morning | ORAL | 0 refills | Status: DC
Start: 2023-07-13 — End: 2023-08-16
  Filled 2023-07-13: qty 30, 30d supply, fill #0

## 2023-07-14 ENCOUNTER — Other Ambulatory Visit (HOSPITAL_BASED_OUTPATIENT_CLINIC_OR_DEPARTMENT_OTHER): Payer: Self-pay

## 2023-07-16 ENCOUNTER — Other Ambulatory Visit (HOSPITAL_BASED_OUTPATIENT_CLINIC_OR_DEPARTMENT_OTHER): Payer: Self-pay

## 2023-07-16 ENCOUNTER — Other Ambulatory Visit: Payer: Self-pay

## 2023-07-19 ENCOUNTER — Ambulatory Visit: Payer: Commercial Managed Care - PPO | Admitting: Urology

## 2023-07-25 ENCOUNTER — Ambulatory Visit
Admission: RE | Admit: 2023-07-25 | Discharge: 2023-07-25 | Disposition: A | Discharge status: Home / Self Care | Payer: Commercial Managed Care - PPO | Source: Ambulatory Visit | Attending: Family Medicine | Admitting: Family Medicine

## 2023-07-25 VITALS — BP 117/77 | HR 72 | Temp 98.0°F | Resp 16

## 2023-07-25 DIAGNOSIS — J039 Acute tonsillitis, unspecified: Secondary | ICD-10-CM

## 2023-07-25 LAB — POCT RAPID STREP A (OFFICE): Rapid Strep A Screen: NEGATIVE

## 2023-07-25 MED ORDER — AMOXICILLIN 875 MG PO TABS: 875.0000 mg | ORAL_TABLET | Freq: Two times a day (BID) | ORAL | 0 refills | Status: DC

## 2023-07-25 NOTE — Discharge Instructions (Addendum)
Take antibiotic asa directed Drink lots of water Tylenol or  ibuprofen for pain

## 2023-07-25 NOTE — ED Provider Notes (Signed)
[name]    CSN: [id number] Arrival date & time: 07/25/23  1002      History   Chief Complaint Chief Complaint  Patient presents with   Sore Throat    Entered by patient    HPI Danny Edwards is a 37 y.o. male.   HPI  Patient states he has had headache body aches fever to 102 and a severe sore throat.  He thought he had strep throat so he started taking some leftover amoxicillin.  I did do a COVID test with that was negative.  He is here for evaluation, has taken 2 days of antibiotic and is now run out  Past Medical History:  Diagnosis Date   ADHD     Patient Active Problem List   Diagnosis Date Noted   Groin pain, left 11/29/2022   Sleep apnea 11/29/2022   Opioid abuse, in remission (HCC) 11/29/2022   Left ear pain 02/16/2021   Low testosterone in male 07/01/2020   Mixed hyperlipidemia 06/13/2020   Anxiety state 04/21/2016   ADHD, predominantly inattentive type 01/25/2016   Tobacco use disorder 01/25/2016    Past Surgical History:  Procedure Laterality Date   KNEE SURGERY Left 2008   ACL tear       Home Medications    Prior to Admission medications   Medication Sig Start Date End Date Taking? Authorizing Provider  acetaminophen (TYLENOL) 500 MG tablet Take 500 mg by mouth every 6 (six) hours as needed.   Yes [provider]  amoxicillin (AMOXIL) 875 MG tablet Take 1 tablet (875 mg total) by mouth 2 (two) times daily. 07/25/23  Yes Eustace Moore, MD  amphetamine-dextroamphetamine (ADDERALL XR) 30 MG 24 hr capsule Take 1 capsule (30 mg total) by mouth every morning. 07/13/23   Bess Kinds, MD  amphetamine-dextroamphetamine (ADDERALL) 30 MG tablet Take 1 tablet by mouth daily. 07/13/23   Bess Kinds, MD  hydrOXYzine (ATARAX) 50 MG tablet Take 1-2 tablets (50-100 mg total) by mouth 3 (three) times daily as needed for anxiety or insomnia. 07/13/23   Bess Kinds, MD    Family History Family History  Problem Relation Age of  Onset   Thyroid disease Mother    Healthy Father    Healthy Sister    Healthy Brother    Healthy Brother     Social History Social History   Tobacco Use   Smoking status: Every Day    Current packs/day: 1.00    Average packs/day: 1 pack/day for 9.0 years (9.0 ttl pk-yrs)    Types: Cigarettes   Smokeless tobacco: Former    Types: Engineer, drilling   Vaping status: Never Used  Substance Use Topics   Alcohol use: Yes    Alcohol/week: 1.0 standard drink of alcohol    Types: 1 Cans of beer per week   Drug use: No     Allergies   Patient has no known allergies.   Review of Systems Review of Systems See HPI  Physical Exam Triage Vital Signs ED Triage Vitals  Encounter Vitals Group     BP 07/25/23 1016 117/77     Systolic BP Percentile --      Diastolic BP Percentile --      Pulse Rate 07/25/23 1016 72     Resp 07/25/23 1016 16     Temp 07/25/23 1016 98 F (36.7 C)     Temp Source 07/25/23 1016 Oral     SpO2 07/25/23 1016 98 %  Weight --      Height --      Head Circumference --      Peak Flow --      Pain Score 07/25/23 1022 7     Pain Loc --      Pain Education --      Exclude from Growth Chart --    No data found.  Updated Vital Signs BP 117/77 (BP Location: Right Arm)   Pulse 72   Temp 98 F (36.7 C) (Oral)   Resp 16   SpO2 98%       Physical Exam Constitutional:      General: He is not in acute distress.    Appearance: He is well-developed and normal weight.  HENT:     Head: Normocephalic and atraumatic.     Right Ear: Tympanic membrane normal.     Left Ear: Tympanic membrane and ear canal normal.     Nose: Congestion present.     Mouth/Throat:     Pharynx: Pharyngeal swelling and posterior oropharyngeal erythema present.     Tonsils: No tonsillar exudate. 2+ on the right. 1+ on the left.  Eyes:     Conjunctiva/sclera: Conjunctivae normal.     Pupils: Pupils are equal, round, and reactive to light.  Cardiovascular:     Rate and  Rhythm: Normal rate.  Pulmonary:     Effort: Pulmonary effort is normal. No respiratory distress.  Abdominal:     General: There is no distension.     Palpations: Abdomen is soft.  Musculoskeletal:        General: Normal range of motion.     Cervical back: Normal range of motion.  Lymphadenopathy:     Cervical: Cervical adenopathy present.  Skin:    General: Skin is warm and dry.  Neurological:     Mental Status: He is alert.      UC Treatments / Results  Labs (all labs ordered are listed, but only abnormal results are displayed) Labs Reviewed  CULTURE, GROUP A STREP [facility])  POCT RAPID STREP A (OFFICE)    EKG   Radiology No results found.  Procedures Procedures (including critical care time)  Medications Ordered in UC Medications - No data to display  Initial Impression / Assessment and Plan / UC Course  I have reviewed the triage vital signs and the nursing notes.  Pertinent labs & imaging results that were available during my care of the patient were reviewed by me and considered in my medical decision making (see chart for details).     Concern for tonsillitis that is asymmetric right greater than left.  He has taken amoxicillin which may affect his rapid strep test.  I am going ahead and give him another week of amoxicillin to complete the 10-day course.   Final Clinical Impressions(s) / UC Diagnoses   Final diagnoses:  Tonsillitis     Discharge Instructions      Take antibiotic asa directed Drink lots of water Tylenol or  ibuprofen for pain   ED Prescriptions     Medication Sig Dispense Auth. Provider   amoxicillin (AMOXIL) 875 MG tablet Take 1 tablet (875 mg total) by mouth 2 (two) times daily. 14 tablet Eustace Moore, MD      PDMP not reviewed this encounter.   Eustace Moore, MD 07/25/23 1224

## 2023-07-25 NOTE — ED Triage Notes (Signed)
Pt reports sore throat x 3 days; fever 102.0 F x 2 days. Tylenol gives some relief. Pt taking amoxicillin (leftover).

## 2023-07-27 ENCOUNTER — Ambulatory Visit
Admission: EM | Admit: 2023-07-27 | Discharge: 2023-07-27 | Disposition: A | Discharge status: Home / Self Care | Payer: Commercial Managed Care - PPO | Attending: Family Medicine | Admitting: Family Medicine

## 2023-07-27 DIAGNOSIS — J039 Acute tonsillitis, unspecified: Secondary | ICD-10-CM

## 2023-07-27 DIAGNOSIS — J029 Acute pharyngitis, unspecified: Secondary | ICD-10-CM

## 2023-07-27 MED ORDER — PREDNISONE 10 MG (21) PO TBPK: ORAL_TABLET | Freq: Every day | ORAL | 0 refills | Status: DC

## 2023-07-27 MED ORDER — METHYLPREDNISOLONE SODIUM SUCC 125 MG IJ SOLR
125.0000 mg | Freq: Once | INTRAMUSCULAR | Status: AC
  Administered 2023-07-27: 125 mg via INTRAMUSCULAR

## 2023-07-27 NOTE — ED Triage Notes (Signed)
Pt presents with c/o worsening sore throat -- denies fever. Pt states he has been taking amoxicillin. Pt reports he is on day 4 of amoxicillin. States he has also been taking zyrtec. Last does was today. Has been using numbing spray.

## 2023-07-27 NOTE — Discharge Instructions (Addendum)
Advised patient to continue previously prescribed Amoxicillin 875 mg twice daily until complete.  Advised patient to start Cooper Render tomorrow morning Saturday, 07/28/2023 please take with food to completion.  Encouraged to increase daily water intake to 64 ounces per day while taking these medications.  Advised if symptoms worsen and/or unresolved please follow-up with PCP, ENT, or here for further evaluation.  ENT contact information provided with this AVS.

## 2023-07-27 NOTE — ED Provider Notes (Signed)
[name]    CSN: [id number] Arrival date & time: 07/27/23  1922      History   Chief Complaint Chief Complaint  Patient presents with   Sore Throat    HPI Danny Edwards is a 37 y.o. male.   HPI  Past Medical History:  Diagnosis Date   ADHD     Patient Active Problem List   Diagnosis Date Noted   Groin pain, left 11/29/2022   Sleep apnea 11/29/2022   Opioid abuse, in remission (HCC) 11/29/2022   Left ear pain 02/16/2021   Low testosterone in male 07/01/2020   Mixed hyperlipidemia 06/13/2020   Anxiety state 04/21/2016   ADHD, predominantly inattentive type 01/25/2016   Tobacco use disorder 01/25/2016    Past Surgical History:  Procedure Laterality Date   KNEE SURGERY Left 2008   ACL tear       Home Medications    Prior to Admission medications   Medication Sig Start Date End Date Taking? Authorizing Provider  predniSONE (STERAPRED UNI-PAK 21 TAB) 10 MG (21) TBPK tablet Take by mouth daily. Take 6 tabs by mouth daily  for 2 days, then 5 tabs for 2 days, then 4 tabs for 2 days, then 3 tabs for 2 days, 2 tabs for 2 days, then 1 tab by mouth daily for 2 days 07/27/23  Yes Trevor Iha, FNP  acetaminophen (TYLENOL) 500 MG tablet Take 500 mg by mouth every 6 (six) hours as needed.    [provider]  amoxicillin (AMOXIL) 875 MG tablet Take 1 tablet (875 mg total) by mouth 2 (two) times daily. 07/25/23   Eustace Moore, MD  amphetamine-dextroamphetamine (ADDERALL XR) 30 MG 24 hr capsule Take 1 capsule (30 mg total) by mouth every morning. 07/13/23   Bess Kinds, MD  amphetamine-dextroamphetamine (ADDERALL) 30 MG tablet Take 1 tablet by mouth daily. 07/13/23   Bess Kinds, MD  hydrOXYzine (ATARAX) 50 MG tablet Take 1-2 tablets (50-100 mg total) by mouth 3 (three) times daily as needed for anxiety or insomnia. 07/13/23   Bess Kinds, MD    Family History Family History  Problem Relation Age of Onset   Thyroid disease Mother     Healthy Father    Healthy Sister    Healthy Brother    Healthy Brother     Social History Social History   Tobacco Use   Smoking status: Every Day    Current packs/day: 1.00    Average packs/day: 1 pack/day for 9.0 years (9.0 ttl pk-yrs)    Types: Cigarettes   Smokeless tobacco: Former    Types: Engineer, drilling   Vaping status: Never Used  Substance Use Topics   Alcohol use: Yes    Alcohol/week: 1.0 standard drink of alcohol    Types: 1 Cans of beer per week   Drug use: No     Allergies   Patient has no known allergies.   Review of Systems Review of Systems  HENT:  Positive for sore throat.   All other systems reviewed and are negative.    Physical Exam Triage Vital Signs ED Triage Vitals  Encounter Vitals Group     BP      Systolic BP Percentile      Diastolic BP Percentile      Pulse      Resp      Temp      Temp src      SpO2      Weight  Height      Head Circumference      Peak Flow      Pain Score      Pain Loc      Pain Education      Exclude from Growth Chart    No data found.  Updated Vital Signs BP 109/73   Pulse 69   Temp 97.8 F (36.6 C) (Oral)   Resp 16   SpO2 98%    Physical Exam Vitals and nursing note reviewed.  Constitutional:      Appearance: Normal appearance. He is normal weight.  HENT:     Head: Normocephalic and atraumatic.     Right Ear: Tympanic membrane, ear canal and external ear normal.     Left Ear: Tympanic membrane, ear canal and external ear normal.     Mouth/Throat:     Mouth: Mucous membranes are moist.     Pharynx: Oropharynx is clear. Uvula midline. Posterior oropharyngeal erythema and uvula swelling present.     Tonsils: 2+ on the right. 2+ on the left.     Comments: Patient reports 8/10 pain with swallowing Eyes:     Extraocular Movements: Extraocular movements intact.     Conjunctiva/sclera: Conjunctivae normal.     Pupils: Pupils are equal, round, and reactive to light.  Cardiovascular:      Rate and Rhythm: Normal rate and regular rhythm.     Pulses: Normal pulses.     Heart sounds: Normal heart sounds.  Pulmonary:     Effort: Pulmonary effort is normal.     Breath sounds: Normal breath sounds.  Musculoskeletal:        General: Normal range of motion.     Cervical back: Normal range of motion and neck supple.  Skin:    General: Skin is warm and dry.  Neurological:     General: No focal deficit present.     Mental Status: He is alert and oriented to person, place, and time. Mental status is at baseline.      UC Treatments / Results  Labs (all labs ordered are listed, but only abnormal results are displayed) Labs Reviewed - No data to display  EKG   Radiology No results found.  Procedures Procedures (including critical care time)  Medications Ordered in UC Medications  methylPREDNISolone sodium succinate (SOLU-MEDROL) 125 mg/2 mL injection 125 mg (125 mg Intramuscular Given 07/27/23 1947)    Initial Impression / Assessment and Plan / UC Course  I have reviewed the triage vital signs and the nursing notes.  Pertinent labs & imaging results that were available during my care of the patient were reviewed by me and considered in my medical decision making (see chart for details).     MDM: 1.  Tonsillitis-Advised patient to continue previously prescribed Amoxicillin 875 mg twice daily until complete.  2.  Acute pharyngitis, unspecified etiology-Solu-Medrol 125 mg IM given once in clinic prior to discharge, Rx'd Sterapred Unipak (tapering from 60 mg to 10 mg over 10 days). Advised patient to start Cooper Render tomorrow morning Saturday, 07/28/2023 please take with food to completion.  Encouraged to increase daily water intake to 64 ounces per day while taking these medications.  Advised if symptoms worsen and/or unresolved please follow-up with PCP, ENT, or here for further evaluation.  ENT contact information provided with this AVS. Patient discharged home,  hemodynamically stable Final Clinical Impressions(s) / UC Diagnoses   Final diagnoses:  Tonsillitis  Acute pharyngitis, unspecified etiology  Discharge Instructions      Advised patient to continue previously prescribed Amoxicillin 875 mg twice daily until complete.  Advised patient to start Cooper Render tomorrow morning Saturday, 07/28/2023 please take with food to completion.  Encouraged to increase daily water intake to 64 ounces per day while taking these medications.  Advised if symptoms worsen and/or unresolved please follow-up with PCP, ENT, or here for further evaluation.  ENT contact information provided with this AVS.     ED Prescriptions     Medication Sig Dispense Auth. Provider   predniSONE (STERAPRED UNI-PAK 21 TAB) 10 MG (21) TBPK tablet Take by mouth daily. Take 6 tabs by mouth daily  for 2 days, then 5 tabs for 2 days, then 4 tabs for 2 days, then 3 tabs for 2 days, 2 tabs for 2 days, then 1 tab by mouth daily for 2 days 42 tablet Trevor Iha, FNP      PDMP not reviewed this encounter.   Trevor Iha, FNP 07/27/23 1950

## 2023-08-16 ENCOUNTER — Other Ambulatory Visit: Payer: Self-pay | Admitting: Student

## 2023-08-16 ENCOUNTER — Other Ambulatory Visit (HOSPITAL_BASED_OUTPATIENT_CLINIC_OR_DEPARTMENT_OTHER): Payer: Self-pay

## 2023-08-16 DIAGNOSIS — F900 Attention-deficit hyperactivity disorder, predominantly inattentive type: Secondary | ICD-10-CM

## 2023-08-16 DIAGNOSIS — F411 Generalized anxiety disorder: Secondary | ICD-10-CM

## 2023-08-16 MED ORDER — AMPHETAMINE-DEXTROAMPHET ER 30 MG PO CP24
30.0000 mg | ORAL_CAPSULE | Freq: Every morning | ORAL | 0 refills | Status: AC
Start: 2023-08-16 — End: ?
  Filled 2023-08-16: qty 30, 30d supply, fill #0

## 2023-08-16 MED ORDER — AMPHETAMINE-DEXTROAMPHETAMINE 30 MG PO TABS
1.0000 | ORAL_TABLET | Freq: Every day | ORAL | 0 refills | Status: AC
Start: 2023-08-16 — End: ?
  Filled 2023-08-16: qty 30, 30d supply, fill #0

## 2023-08-16 MED ORDER — HYDROXYZINE HCL 50 MG PO TABS
50.0000 mg | ORAL_TABLET | Freq: Three times a day (TID) | ORAL | 0 refills | Status: AC | PRN
Start: 2023-08-16 — End: ?
  Filled 2023-08-16: qty 90, 15d supply, fill #0

## 2023-09-17 ENCOUNTER — Other Ambulatory Visit (HOSPITAL_BASED_OUTPATIENT_CLINIC_OR_DEPARTMENT_OTHER): Payer: Self-pay

## 2023-09-17 ENCOUNTER — Other Ambulatory Visit: Payer: Self-pay | Admitting: Student

## 2023-09-17 DIAGNOSIS — F900 Attention-deficit hyperactivity disorder, predominantly inattentive type: Secondary | ICD-10-CM

## 2023-09-17 DIAGNOSIS — F411 Generalized anxiety disorder: Secondary | ICD-10-CM

## 2023-09-17 MED ORDER — HYDROXYZINE HCL 50 MG PO TABS
50.0000 mg | ORAL_TABLET | Freq: Three times a day (TID) | ORAL | 0 refills | Status: DC | PRN
  Filled 2023-09-17 (×2): qty 90, 15d supply, fill #0

## 2023-09-17 MED ORDER — AMPHETAMINE-DEXTROAMPHETAMINE 30 MG PO TABS
1.0000 | ORAL_TABLET | Freq: Every day | ORAL | 0 refills | Status: DC
Start: 2023-09-17 — End: 2023-10-22
  Filled 2023-09-17: qty 30, 30d supply, fill #0

## 2023-09-17 MED ORDER — AMPHETAMINE-DEXTROAMPHET ER 30 MG PO CP24
30.0000 mg | ORAL_CAPSULE | Freq: Every morning | ORAL | 0 refills | Status: DC
Start: 2023-09-17 — End: 2023-10-22
  Filled 2023-09-17: qty 30, 30d supply, fill #0

## 2023-09-18 ENCOUNTER — Other Ambulatory Visit: Payer: Self-pay

## 2023-10-08 ENCOUNTER — Ambulatory Visit
Admission: EM | Admit: 2023-10-08 | Discharge: 2023-10-08 | Disposition: A | Discharge status: Home / Self Care | Payer: Commercial Managed Care - PPO | Attending: Family Medicine | Admitting: Family Medicine

## 2023-10-08 DIAGNOSIS — J0101 Acute recurrent maxillary sinusitis: Secondary | ICD-10-CM

## 2023-10-08 MED ORDER — PREDNISONE 50 MG PO TABS: ORAL_TABLET | ORAL | 0 refills | Status: DC

## 2023-10-08 MED ORDER — AMOXICILLIN-POT CLAVULANATE 875-125 MG PO TABS: 1.0000 | ORAL_TABLET | Freq: Two times a day (BID) | ORAL | 0 refills | Status: DC

## 2023-10-08 NOTE — Discharge Instructions (Signed)
Take the Augmentin 2 times a day for a week Take this medicine with food.  If your stomach becomes upset take a probiotic Take the prednisone once a day for 5 days.  This will reduce the congestion in your sinuses Drink lots of water Consider Mucinex to loosen the sinus congestion Return if not improving by next week

## 2023-10-08 NOTE — ED Provider Notes (Signed)
[name]    CSN: [id number] Arrival date & time: 10/08/23  1701      History   Chief Complaint Chief Complaint  Patient presents with   Nasal Congestion    HPI Danny Edwards is a 37 y.o. male.   Patient states he has had some sinus congestion for a month.  At first he thought he might have some allergies or a cold.  He does have 2 daughters at home that are 6 months and 51-1/36 years old.  They do have daycare respiratory infections.  He states that he has pressure and pain in his sinuses, postnasal drip, thick drainage.  Starting to have more sore throat and headache.  He is not getting better with over-the-counter medicines.    Past Medical History:  Diagnosis Date   ADHD     Patient Active Problem List   Diagnosis Date Noted   Groin pain, left 11/29/2022   Sleep apnea 11/29/2022   Opioid abuse, in remission (HCC) 11/29/2022   Left ear pain 02/16/2021   Low testosterone in male 07/01/2020   Mixed hyperlipidemia 06/13/2020   Anxiety state 04/21/2016   ADHD, predominantly inattentive type 01/25/2016   Tobacco use disorder 01/25/2016    Past Surgical History:  Procedure Laterality Date   KNEE SURGERY Left 2008   ACL tear       Home Medications    Prior to Admission medications   Medication Sig Start Date End Date Taking? Authorizing Provider  amoxicillin-clavulanate (AUGMENTIN) 875-125 MG tablet Take 1 tablet by mouth every 12 (twelve) hours. 10/08/23  Yes Eustace Moore, MD  predniSONE (DELTASONE) 50 MG tablet Take once a day for 5 days.  Take with food 10/08/23  Yes Eustace Moore, MD  acetaminophen (TYLENOL) 500 MG tablet Take 500 mg by mouth every 6 (six) hours as needed.    [provider]  amphetamine-dextroamphetamine (ADDERALL XR) 30 MG 24 hr capsule Take 1 capsule (30 mg total) by mouth every morning. 09/17/23   Bess Kinds, MD  amphetamine-dextroamphetamine (ADDERALL) 30 MG tablet Take 1 tablet by mouth daily.  09/17/23   Bess Kinds, MD  hydrOXYzine (ATARAX) 50 MG tablet Take 1-2 tablets (50-100 mg total) by mouth 3 (three) times daily as needed for anxiety or insomnia. 09/17/23   Bess Kinds, MD    Family History Family History  Problem Relation Age of Onset   Thyroid disease Mother    Healthy Father    Healthy Sister    Healthy Brother    Healthy Brother     Social History Social History   Tobacco Use   Smoking status: Every Day    Current packs/day: 1.00    Average packs/day: 1 pack/day for 9.0 years (9.0 ttl pk-yrs)    Types: Cigarettes   Smokeless tobacco: Former    Types: Engineer, drilling   Vaping status: Never Used  Substance Use Topics   Alcohol use: Yes    Alcohol/week: 1.0 standard drink of alcohol    Types: 1 Cans of beer per week   Drug use: No     Allergies   Patient has no known allergies.   Review of Systems Review of Systems   Physical Exam Triage Vital Signs ED Triage Vitals  Encounter Vitals Group     BP 10/08/23 1713 116/76     Systolic BP Percentile --      Diastolic BP Percentile --      Pulse Rate 10/08/23 1713 84  Resp 10/08/23 1713 17     Temp 10/08/23 1713 97.7 F (36.5 C)     Temp Source 10/08/23 1713 Oral     SpO2 10/08/23 1713 95 %     Weight --      Height --      Head Circumference --      Peak Flow --      Pain Score 10/08/23 1714 0     Pain Loc --      Pain Education --      Exclude from Growth Chart --    No data found.  Updated Vital Signs BP 116/76 (BP Location: Right Arm)   Pulse 84   Temp 97.7 F (36.5 C) (Oral)   Resp 17   SpO2 95%      Physical Exam Constitutional:      General: He is not in acute distress.    Appearance: He is well-developed. He is ill-appearing.  HENT:     Head: Normocephalic and atraumatic.     Right Ear: Tympanic membrane normal.     Left Ear: Tympanic membrane and ear canal normal.     Nose: Congestion and rhinorrhea present.     Mouth/Throat:     Pharynx: Posterior  oropharyngeal erythema present.  Eyes:     Conjunctiva/sclera: Conjunctivae normal.     Pupils: Pupils are equal, round, and reactive to light.  Cardiovascular:     Rate and Rhythm: Normal rate and regular rhythm.     Heart sounds: Normal heart sounds.  Pulmonary:     Effort: Pulmonary effort is normal. No respiratory distress.     Breath sounds: Normal breath sounds.  Abdominal:     General: There is no distension.     Palpations: Abdomen is soft.  Musculoskeletal:        General: Normal range of motion.     Cervical back: Normal range of motion.  Lymphadenopathy:     Cervical: Cervical adenopathy present.  Skin:    General: Skin is warm and dry.  Neurological:     Mental Status: He is alert.      UC Treatments / Results  Labs (all labs ordered are listed, but only abnormal results are displayed) Labs Reviewed - No data to display  EKG   Radiology No results found.  Procedures Procedures (including critical care time)  Medications Ordered in UC Medications - No data to display  Initial Impression / Assessment and Plan / UC Course  I have reviewed the triage vital signs and the nursing notes.  Pertinent labs & imaging results that were available during my care of the patient were reviewed by me and considered in my medical decision making (see chart for details).     Final Clinical Impressions(s) / UC Diagnoses   Final diagnoses:  Acute recurrent maxillary sinusitis     Discharge Instructions      Take the Augmentin 2 times a day for a week Take this medicine with food.  If your stomach becomes upset take a probiotic Take the prednisone once a day for 5 days.  This will reduce the congestion in your sinuses Drink lots of water Consider Mucinex to loosen the sinus congestion Return if not improving by next week   ED Prescriptions     Medication Sig Dispense Auth. Provider   amoxicillin-clavulanate (AUGMENTIN) 875-125 MG tablet Take 1 tablet by  mouth every 12 (twelve) hours. 14 tablet Eustace Moore, MD   predniSONE (DELTASONE) 50 MG  tablet Take once a day for 5 days.  Take with food 5 tablet Eustace Moore, MD      PDMP not reviewed this encounter.   Eustace Moore, MD 10/08/23 [phone]

## 2023-10-08 NOTE — ED Triage Notes (Signed)
Pt c/o nasal congestion x 1 month. Denies fever. Taking tylenol severe sinus prn.

## 2023-10-22 ENCOUNTER — Other Ambulatory Visit: Payer: Self-pay | Admitting: Student

## 2023-10-22 DIAGNOSIS — F411 Generalized anxiety disorder: Secondary | ICD-10-CM

## 2023-10-22 DIAGNOSIS — F900 Attention-deficit hyperactivity disorder, predominantly inattentive type: Secondary | ICD-10-CM

## 2023-10-23 ENCOUNTER — Other Ambulatory Visit: Payer: Self-pay

## 2023-10-23 ENCOUNTER — Other Ambulatory Visit (HOSPITAL_BASED_OUTPATIENT_CLINIC_OR_DEPARTMENT_OTHER): Payer: Self-pay

## 2023-10-23 MED ORDER — HYDROXYZINE HCL 50 MG PO TABS
50.0000 mg | ORAL_TABLET | Freq: Three times a day (TID) | ORAL | 0 refills | Status: DC | PRN
Start: 2023-10-23 — End: 2023-11-26
  Filled 2023-10-23: qty 90, 15d supply, fill #0

## 2023-10-23 MED ORDER — AMPHETAMINE-DEXTROAMPHET ER 30 MG PO CP24
30.0000 mg | ORAL_CAPSULE | Freq: Every morning | ORAL | 0 refills | Status: DC
  Filled 2023-10-23: qty 30, 30d supply, fill #0

## 2023-10-23 MED ORDER — AMPHETAMINE-DEXTROAMPHETAMINE 30 MG PO TABS
1.0000 | ORAL_TABLET | Freq: Every day | ORAL | 0 refills | Status: DC
Start: 2023-10-23 — End: 2023-11-26
  Filled 2023-10-23: qty 30, 30d supply, fill #0

## 2023-11-26 ENCOUNTER — Other Ambulatory Visit: Payer: Self-pay | Admitting: Student

## 2023-11-26 DIAGNOSIS — F411 Generalized anxiety disorder: Secondary | ICD-10-CM

## 2023-11-26 DIAGNOSIS — F900 Attention-deficit hyperactivity disorder, predominantly inattentive type: Secondary | ICD-10-CM

## 2023-11-27 ENCOUNTER — Other Ambulatory Visit (HOSPITAL_BASED_OUTPATIENT_CLINIC_OR_DEPARTMENT_OTHER): Payer: Self-pay

## 2023-11-27 MED ORDER — AMPHETAMINE-DEXTROAMPHETAMINE 30 MG PO TABS
1.0000 | ORAL_TABLET | Freq: Every day | ORAL | 0 refills | Status: DC
Start: 2023-11-27 — End: 2023-12-27
  Filled 2023-11-27: qty 30, 30d supply, fill #0

## 2023-11-27 MED ORDER — AMPHETAMINE-DEXTROAMPHET ER 30 MG PO CP24
30.0000 mg | ORAL_CAPSULE | Freq: Every morning | ORAL | 0 refills | Status: DC
  Filled 2023-11-27: qty 30, 30d supply, fill #0

## 2023-11-27 MED ORDER — HYDROXYZINE HCL 50 MG PO TABS
50.0000 mg | ORAL_TABLET | Freq: Three times a day (TID) | ORAL | 0 refills | Status: DC | PRN
  Filled 2023-11-27: qty 90, 15d supply, fill #0

## 2023-12-27 ENCOUNTER — Telehealth: Payer: Self-pay

## 2023-12-27 ENCOUNTER — Other Ambulatory Visit: Payer: Self-pay | Admitting: Student

## 2023-12-27 ENCOUNTER — Other Ambulatory Visit (HOSPITAL_BASED_OUTPATIENT_CLINIC_OR_DEPARTMENT_OTHER): Payer: Self-pay

## 2023-12-27 DIAGNOSIS — F411 Generalized anxiety disorder: Secondary | ICD-10-CM

## 2023-12-27 DIAGNOSIS — F900 Attention-deficit hyperactivity disorder, predominantly inattentive type: Secondary | ICD-10-CM

## 2023-12-27 MED ORDER — AMPHETAMINE-DEXTROAMPHETAMINE 30 MG PO TABS
1.0000 | ORAL_TABLET | Freq: Every day | ORAL | 0 refills | Status: DC
  Filled 2023-12-27: qty 30, 30d supply, fill #0

## 2023-12-27 MED ORDER — HYDROXYZINE HCL 50 MG PO TABS
50.0000 mg | ORAL_TABLET | Freq: Three times a day (TID) | ORAL | 0 refills | Status: DC | PRN
  Filled 2023-12-27: qty 90, 15d supply, fill #0

## 2023-12-27 MED FILL — Amphetamine-Dextroamphetamine Cap ER 24HR 30 MG: ORAL | 30 days supply | Qty: 30 | Fill #0 | Status: AC

## 2023-12-27 NOTE — Telephone Encounter (Signed)
 Called patient and his wife using both numbers on file, no one picked up. Will try again later. Penni Bombard CMA

## 2023-12-27 NOTE — Telephone Encounter (Signed)
-----   Message from Penne Rhein sent at 12/27/2023  3:02 PM EST ----- Regarding: Check up Appt Hey team!  Can you please call this patient and get them scheduled for a check up? We usually check in every so often (~3-6 months), as he's on adderall . Just wanting to see him to check in and have/obtain UDS.   Let me know if you need anything from me!  Thank you very much!  -BS

## 2023-12-28 ENCOUNTER — Telehealth: Payer: Self-pay

## 2023-12-28 ENCOUNTER — Encounter: Payer: Self-pay | Admitting: *Deleted

## 2023-12-28 NOTE — Telephone Encounter (Signed)
 Called patient again, no answer, LVM on [phone] informing patient to schedule an appointment with Dr. Barbaraann Faster to follow up on his medications. Penni Bombard CMA

## 2023-12-28 NOTE — Telephone Encounter (Signed)
-----   Message from Penne Rhein sent at 12/27/2023  3:02 PM EST ----- Regarding: Check up Appt Hey team!  Can you please call this patient and get them scheduled for a check up? We usually check in every so often (~3-6 months), as he's on adderall . Just wanting to see him to check in and have/obtain UDS.   Let me know if you need anything from me!  Thank you very much!  -BS

## 2024-02-06 ENCOUNTER — Other Ambulatory Visit: Payer: Self-pay | Admitting: Student

## 2024-02-06 DIAGNOSIS — F411 Generalized anxiety disorder: Secondary | ICD-10-CM

## 2024-02-06 DIAGNOSIS — F900 Attention-deficit hyperactivity disorder, predominantly inattentive type: Secondary | ICD-10-CM

## 2024-02-07 ENCOUNTER — Other Ambulatory Visit (HOSPITAL_BASED_OUTPATIENT_CLINIC_OR_DEPARTMENT_OTHER): Payer: Self-pay

## 2024-02-07 MED ORDER — AMPHETAMINE-DEXTROAMPHET ER 30 MG PO CP24
30.0000 mg | ORAL_CAPSULE | Freq: Every morning | ORAL | 0 refills | Status: DC
  Filled 2024-02-07: qty 30, 30d supply, fill #0

## 2024-02-07 MED ORDER — AMPHETAMINE-DEXTROAMPHETAMINE 30 MG PO TABS
1.0000 | ORAL_TABLET | Freq: Every day | ORAL | 0 refills | Status: DC
  Filled 2024-02-07: qty 30, 30d supply, fill #0

## 2024-02-07 MED ORDER — HYDROXYZINE HCL 50 MG PO TABS
50.0000 mg | ORAL_TABLET | Freq: Three times a day (TID) | ORAL | 0 refills | Status: DC | PRN
  Filled 2024-02-07: qty 90, 15d supply, fill #0

## 2024-03-11 ENCOUNTER — Ambulatory Visit: Admitting: Student

## 2024-03-11 ENCOUNTER — Encounter: Payer: Self-pay | Admitting: Student

## 2024-03-11 ENCOUNTER — Other Ambulatory Visit: Payer: Self-pay | Admitting: Student

## 2024-03-11 VITALS — BP 117/83 | HR 88 | Ht 69.5 in | Wt 209.0 lb

## 2024-03-11 DIAGNOSIS — F411 Generalized anxiety disorder: Secondary | ICD-10-CM

## 2024-03-11 DIAGNOSIS — F900 Attention-deficit hyperactivity disorder, predominantly inattentive type: Secondary | ICD-10-CM | POA: Diagnosis not present

## 2024-03-11 DIAGNOSIS — F17200 Nicotine dependence, unspecified, uncomplicated: Secondary | ICD-10-CM

## 2024-03-11 NOTE — Patient Instructions (Signed)
 It was great to see you! Thank you for allowing me to participate in your care!  I recommend that you always bring your medications to each appointment as this makes it easy to ensure we are on the correct medications and helps [place] not miss when refills are needed.  Our plans for today:  - ADHD Check in  Continue adderall daily as needed  Urine Drug Screen today  Follow up in 3 months  We are checking some labs today, I will call you if they are abnormal will send you a MyChart message or a letter if they are normal.  If you do not hear about your labs in the next 2 weeks please let us know.  Take care and seek immediate care sooner if you develop any concerns.   Dr. Bess Kinds, MD [facility]

## 2024-03-11 NOTE — Progress Notes (Signed)
  SUBJECTIVE:   CHIEF COMPLAINT / HPI:   ADHD F/u -Meds: Adderall 30 mg XR and adderall 30 mg  He is taking Adderall XR 30 mg in the morning and an additional 30 mg dose in the early afternoon. He reports that everything is going well with his current regimen and denies any issues with the medication.  He mentions a lack of sleep, attributing it to his young children, but does not report any sleep disturbances related to his medication. He has two daughters, the youngest recently turned one, and the oldest will be three in May. Despite the lack of sleep due to his children, he reports sleeping well.  He has successfully quit smoking cigarettes for about three years, although he occasionally smokes cigars on weekends. This cessation was motivated by the birth of his first child. He has been off methadone for about a year, having previously used it for an unspecified duration. He indicates successful discontinuation of the medication.  He has been trying to lose weight and has reached 209 pounds, down from 207 pounds last July. He notes that 200 pounds is a comfortable weight for him, reminiscent of his high school weight of 170 pounds.   PERTINENT  PMH / PSH:   Past Medical History:  Diagnosis Date   ADHD    OBJECTIVE:  There were no vitals taken for this visit. Physical Exam Constitutional:      Appearance: Normal appearance.  Pulmonary:     Effort: Pulmonary effort is normal. No respiratory distress.     Breath sounds: Normal breath sounds. No stridor. No wheezing, rhonchi or rales.  Chest:     Chest wall: No tenderness.  Abdominal:     General: Abdomen is flat. Bowel sounds are normal. There is no distension.     Palpations: Abdomen is soft. There is no mass.     Tenderness: There is no abdominal tenderness. There is no guarding.     Hernia: No hernia is present.  Neurological:     Mental Status: He is alert.  Psychiatric:        Mood and Affect: Mood normal.         Behavior: Behavior normal.      ASSESSMENT/PLAN:   Assessment & Plan ADHD, predominantly inattentive type ADHD well-managed, no issues with sleep or weight loss 2/2 medication. Will continue medication and have patient follow up in 3 months. Will obtain UDS today, and refill medications as appropriate.  - Continue Adderall XR 30 mg daily. - Continue Adderal 30 mg daily in afternoon - UDS today - Schedule follow-up in 3 months. Tobacco use disorder Patient has quit smoking for nearly 1 year. Occasionally smokes cigars on the weekends. Encouraged patient to continue the good work! -F/u prn No follow-ups on file. Bess Kinds, MD 03/11/2024, 6:27 AM PGY-3, [facility]

## 2024-03-11 NOTE — Assessment & Plan Note (Addendum)
 ADHD well-managed, no issues with sleep or weight loss 2/2 medication. Will continue medication and have patient follow up in 3 months. Will obtain UDS today, and refill medications as appropriate.  - Continue Adderall XR 30 mg daily. - Continue Adderal 30 mg daily in afternoon - UDS today - Schedule follow-up in 3 months.

## 2024-03-11 NOTE — Assessment & Plan Note (Addendum)
 Patient has quit smoking for nearly 1 year. Occasionally smokes cigars on the weekends. Encouraged patient to continue the good work! -F/u prn

## 2024-03-12 ENCOUNTER — Encounter: Payer: Self-pay | Admitting: Student

## 2024-03-13 ENCOUNTER — Encounter: Payer: Self-pay | Admitting: Student

## 2024-03-13 ENCOUNTER — Other Ambulatory Visit (HOSPITAL_BASED_OUTPATIENT_CLINIC_OR_DEPARTMENT_OTHER): Payer: Self-pay

## 2024-03-13 LAB — TOXASSURE SELECT 13 (MW), URINE

## 2024-03-13 MED ORDER — AMPHETAMINE-DEXTROAMPHET ER 30 MG PO CP24
30.0000 mg | ORAL_CAPSULE | Freq: Every morning | ORAL | 0 refills | Status: DC
  Filled 2024-03-13: qty 30, 30d supply, fill #0

## 2024-03-13 MED ORDER — HYDROXYZINE HCL 50 MG PO TABS
50.0000 mg | ORAL_TABLET | Freq: Three times a day (TID) | ORAL | 0 refills | Status: DC | PRN
  Filled 2024-03-13: qty 90, 15d supply, fill #0

## 2024-03-13 MED ORDER — AMPHETAMINE-DEXTROAMPHETAMINE 30 MG PO TABS
1.0000 | ORAL_TABLET | Freq: Every day | ORAL | 0 refills | Status: DC
  Filled 2024-03-13: qty 30, 30d supply, fill #0

## 2024-04-17 ENCOUNTER — Other Ambulatory Visit: Payer: Self-pay | Admitting: Student

## 2024-04-17 DIAGNOSIS — F900 Attention-deficit hyperactivity disorder, predominantly inattentive type: Secondary | ICD-10-CM

## 2024-04-17 DIAGNOSIS — F411 Generalized anxiety disorder: Secondary | ICD-10-CM

## 2024-04-17 MED ORDER — AMPHETAMINE-DEXTROAMPHET ER 30 MG PO CP24
30.0000 mg | ORAL_CAPSULE | Freq: Every morning | ORAL | 0 refills | Status: DC
  Filled 2024-04-17: qty 30, 30d supply, fill #0

## 2024-04-17 MED ORDER — HYDROXYZINE HCL 50 MG PO TABS
50.0000 mg | ORAL_TABLET | Freq: Three times a day (TID) | ORAL | 0 refills | Status: DC | PRN
Start: 2024-04-17 — End: 2024-05-19
  Filled 2024-04-17: qty 90, 15d supply, fill #0

## 2024-04-17 MED ORDER — AMPHETAMINE-DEXTROAMPHETAMINE 30 MG PO TABS
1.0000 | ORAL_TABLET | Freq: Every day | ORAL | 0 refills | Status: DC
  Filled 2024-04-17: qty 30, 30d supply, fill #0

## 2024-04-18 ENCOUNTER — Other Ambulatory Visit: Payer: Self-pay

## 2024-04-18 ENCOUNTER — Other Ambulatory Visit (HOSPITAL_BASED_OUTPATIENT_CLINIC_OR_DEPARTMENT_OTHER): Payer: Self-pay

## 2024-05-19 ENCOUNTER — Other Ambulatory Visit (HOSPITAL_BASED_OUTPATIENT_CLINIC_OR_DEPARTMENT_OTHER): Payer: Self-pay

## 2024-05-19 ENCOUNTER — Other Ambulatory Visit: Payer: Self-pay | Admitting: Student

## 2024-05-19 ENCOUNTER — Other Ambulatory Visit: Payer: Self-pay

## 2024-05-19 DIAGNOSIS — F411 Generalized anxiety disorder: Secondary | ICD-10-CM

## 2024-05-19 DIAGNOSIS — F900 Attention-deficit hyperactivity disorder, predominantly inattentive type: Secondary | ICD-10-CM

## 2024-05-19 MED ORDER — AMPHETAMINE-DEXTROAMPHET ER 30 MG PO CP24
30.0000 mg | ORAL_CAPSULE | Freq: Every morning | ORAL | 0 refills | Status: DC
  Filled 2024-05-19: qty 30, 30d supply, fill #0

## 2024-05-19 MED ORDER — AMPHETAMINE-DEXTROAMPHETAMINE 30 MG PO TABS
30.0000 mg | ORAL_TABLET | Freq: Every day | ORAL | 0 refills | Status: DC
  Filled 2024-05-19: qty 30, 30d supply, fill #0

## 2024-05-19 MED ORDER — HYDROXYZINE HCL 50 MG PO TABS
50.0000 mg | ORAL_TABLET | Freq: Three times a day (TID) | ORAL | 0 refills | Status: DC | PRN
  Filled 2024-05-19: qty 90, 15d supply, fill #0

## 2024-05-27 ENCOUNTER — Encounter: Payer: Self-pay | Admitting: *Deleted

## 2024-06-18 ENCOUNTER — Other Ambulatory Visit: Payer: Self-pay | Admitting: Student

## 2024-06-18 DIAGNOSIS — F411 Generalized anxiety disorder: Secondary | ICD-10-CM

## 2024-06-18 DIAGNOSIS — F900 Attention-deficit hyperactivity disorder, predominantly inattentive type: Secondary | ICD-10-CM

## 2024-06-20 MED ORDER — AMPHETAMINE-DEXTROAMPHET ER 30 MG PO CP24
30.0000 mg | ORAL_CAPSULE | Freq: Every morning | ORAL | 0 refills | Status: DC
  Filled 2024-06-20: qty 30, 30d supply, fill #0

## 2024-06-20 MED ORDER — AMPHETAMINE-DEXTROAMPHETAMINE 30 MG PO TABS
30.0000 mg | ORAL_TABLET | Freq: Every day | ORAL | 0 refills | Status: DC
  Filled 2024-06-20: qty 30, 30d supply, fill #0

## 2024-06-20 MED ORDER — HYDROXYZINE HCL 50 MG PO TABS
50.0000 mg | ORAL_TABLET | Freq: Three times a day (TID) | ORAL | 0 refills | Status: DC | PRN
Start: 2024-06-20 — End: 2024-07-21
  Filled 2024-06-20: qty 90, 15d supply, fill #0

## 2024-06-23 ENCOUNTER — Other Ambulatory Visit (HOSPITAL_BASED_OUTPATIENT_CLINIC_OR_DEPARTMENT_OTHER): Payer: Self-pay

## 2024-06-26 ENCOUNTER — Other Ambulatory Visit (HOSPITAL_BASED_OUTPATIENT_CLINIC_OR_DEPARTMENT_OTHER): Payer: Self-pay

## 2024-07-10 ENCOUNTER — Ambulatory Visit: Admitting: Family Medicine

## 2024-07-21 ENCOUNTER — Ambulatory Visit: Admitting: Orthopaedic Surgery

## 2024-07-21 ENCOUNTER — Other Ambulatory Visit: Payer: Self-pay | Admitting: Student

## 2024-07-21 DIAGNOSIS — F411 Generalized anxiety disorder: Secondary | ICD-10-CM

## 2024-07-21 DIAGNOSIS — F900 Attention-deficit hyperactivity disorder, predominantly inattentive type: Secondary | ICD-10-CM

## 2024-07-22 MED ORDER — AMPHETAMINE-DEXTROAMPHETAMINE 30 MG PO TABS
30.0000 mg | ORAL_TABLET | Freq: Every day | ORAL | 0 refills | Status: DC
  Filled 2024-07-22 – 2024-07-24 (×2): qty 15, 15d supply, fill #0

## 2024-07-22 MED ORDER — HYDROXYZINE HCL 50 MG PO TABS
50.0000 mg | ORAL_TABLET | Freq: Three times a day (TID) | ORAL | 0 refills | Status: DC | PRN
  Filled 2024-07-22 – 2024-07-23 (×2): qty 21, 4d supply, fill #0

## 2024-07-23 ENCOUNTER — Other Ambulatory Visit: Payer: Self-pay | Admitting: Student

## 2024-07-23 ENCOUNTER — Other Ambulatory Visit (HOSPITAL_BASED_OUTPATIENT_CLINIC_OR_DEPARTMENT_OTHER): Payer: Self-pay

## 2024-07-23 DIAGNOSIS — F900 Attention-deficit hyperactivity disorder, predominantly inattentive type: Secondary | ICD-10-CM

## 2024-07-24 ENCOUNTER — Other Ambulatory Visit (HOSPITAL_BASED_OUTPATIENT_CLINIC_OR_DEPARTMENT_OTHER): Payer: Self-pay

## 2024-07-29 DIAGNOSIS — J029 Acute pharyngitis, unspecified: Secondary | ICD-10-CM | POA: Diagnosis not present

## 2024-07-29 DIAGNOSIS — R590 Localized enlarged lymph nodes: Secondary | ICD-10-CM | POA: Diagnosis not present

## 2024-08-09 ENCOUNTER — Ambulatory Visit
Admission: RE | Admit: 2024-08-09 | Discharge: 2024-08-09 | Disposition: A | Discharge status: Home / Self Care | Source: Ambulatory Visit | Attending: Family Medicine | Admitting: Family Medicine

## 2024-08-09 ENCOUNTER — Other Ambulatory Visit: Payer: Self-pay

## 2024-08-09 VITALS — BP 121/78 | HR 70 | Temp 98.0°F | Resp 17

## 2024-08-09 DIAGNOSIS — R0981 Nasal congestion: Secondary | ICD-10-CM

## 2024-08-09 DIAGNOSIS — J309 Allergic rhinitis, unspecified: Secondary | ICD-10-CM

## 2024-08-09 DIAGNOSIS — J0100 Acute maxillary sinusitis, unspecified: Secondary | ICD-10-CM | POA: Diagnosis not present

## 2024-08-09 MED ORDER — AMOXICILLIN-POT CLAVULANATE 875-125 MG PO TABS: 1.0000 | ORAL_TABLET | Freq: Two times a day (BID) | ORAL | 0 refills | Status: AC

## 2024-08-09 MED ORDER — FEXOFENADINE HCL 180 MG PO TABS: 180.0000 mg | ORAL_TABLET | Freq: Every day | ORAL | 0 refills | Status: AC

## 2024-08-09 MED ORDER — PREDNISONE 20 MG PO TABS: ORAL_TABLET | ORAL | 0 refills | Status: AC

## 2024-08-09 NOTE — ED Triage Notes (Signed)
 Pt c/o cough, sore throat and runny nose x 10 days. Did a round of cefdinir  prescribed while on vacation but doesn't feel much improvement. Taking sudafed prn.

## 2024-08-09 NOTE — ED Provider Notes (Signed)
 [name]    CSN: [id number] Arrival date & time: 08/09/24  0849      History   Chief Complaint Chief Complaint  Patient presents with   Nasal Congestion   Cough   Sore Throat    HPI Danny Edwards is a 38 y.o. male.   HPI 38 year old male presents with cough, sore throat, and nasal congestion for 10 days.  Patient reports taking cefdinir  while on vacation; however, reports symptoms have not resolved.  PMH significant for ADHD, sleep apnea, and tobacco use disorder.  Past Medical History:  Diagnosis Date   ADHD     Patient Active Problem List   Diagnosis Date Noted   Groin pain, left 11/29/2022   Sleep apnea 11/29/2022   Opioid abuse, in remission (HCC) 11/29/2022   Left ear pain 02/16/2021   Low testosterone  in male 07/01/2020   Mixed hyperlipidemia 06/13/2020   Anxiety state 04/21/2016   ADHD, predominantly inattentive type 01/25/2016   Tobacco use disorder 01/25/2016    Past Surgical History:  Procedure Laterality Date   KNEE SURGERY Left 2008   ACL tear       Home Medications    Prior to Admission medications   Medication Sig Start Date End Date Taking? Authorizing Provider  amoxicillin -clavulanate (AUGMENTIN ) 875-125 MG tablet Take 1 tablet by mouth every 12 (twelve) hours. 08/09/24  Yes Teddy Sharper, FNP  fexofenadine  (ALLEGRA  ALLERGY) 180 MG tablet Take 1 tablet (180 mg total) by mouth daily for 15 days. 08/09/24 08/24/24 Yes Teddy Sharper, FNP  predniSONE  (DELTASONE ) 20 MG tablet Take 3 tabs PO daily x 5 days. 08/09/24  Yes Teddy Sharper, FNP  acetaminophen (TYLENOL) 500 MG tablet Take 500 mg by mouth every 6 (six) hours as needed.    [provider]  amphetamine -dextroamphetamine  (ADDERALL  XR) 30 MG 24 hr capsule Take 1 capsule (30 mg total) by mouth every morning. 06/20/24   Sowell, Brandon, MD  amphetamine -dextroamphetamine  (ADDERALL ) 30 MG tablet Take 1 tablet (30 mg total) by mouth daily. *Need to make appointment to see PCP.*  07/22/24   Majeed, Camie, DO  hydrOXYzine  (ATARAX ) 50 MG tablet Take 1-2 tablets (50-100 mg total) by mouth 3 (three) times daily as needed. *Need to make appointment to see PCP.* 07/22/24   Lupie Camie, DO    Family History Family History  Problem Relation Age of Onset   Thyroid  disease Mother    Healthy Father    Healthy Sister    Healthy Brother    Healthy Brother     Social History Social History   Tobacco Use   Smoking status: Every Day    Current packs/day: 1.00    Average packs/day: 1 pack/day for 9.0 years (9.0 ttl pk-yrs)    Types: Cigarettes   Smokeless tobacco: Former    Types: Engineer, drilling   Vaping status: Never Used  Substance Use Topics   Alcohol  use: Yes    Alcohol /week: 1.0 standard drink of alcohol     Types: 1 Cans of beer per week   Drug use: No     Allergies   Patient has no known allergies.   Review of Systems Review of Systems   Physical Exam Triage Vital Signs ED Triage Vitals  Encounter Vitals Group     BP      Girls Systolic BP Percentile      Girls Diastolic BP Percentile      Boys Systolic BP Percentile      Boys Diastolic  BP Percentile      Pulse      Resp      Temp      Temp src      SpO2      Weight      Height      Head Circumference      Peak Flow      Pain Score      Pain Loc      Pain Education      Exclude from Growth Chart    No data found.  Updated Vital Signs BP 121/78 (BP Location: Right Arm)   Pulse 70   Temp 98 F (36.7 C) (Oral)   Resp 17   SpO2 99%    Physical Exam Vitals and nursing note reviewed.  Constitutional:      Appearance: Normal appearance. He is normal weight. He is ill-appearing.  HENT:     Head: Normocephalic and atraumatic.     Right Ear: Tympanic membrane and external ear normal.     Left Ear: Tympanic membrane and external ear normal.     Ears:     Comments: Significant eustachian tube dysfunction noted bilaterally    Mouth/Throat:     Mouth: Mucous membranes are moist.      Pharynx: Oropharynx is clear.     Comments: Significant amount of clear drainage of posterior oropharynx noted Eyes:     Extraocular Movements: Extraocular movements intact.     Pupils: Pupils are equal, round, and reactive to light.  Cardiovascular:     Rate and Rhythm: Normal rate and regular rhythm.     Pulses: Normal pulses.     Heart sounds: Normal heart sounds.  Pulmonary:     Effort: Pulmonary effort is normal.     Breath sounds: Normal breath sounds. No wheezing, rhonchi or rales.  Musculoskeletal:        General: Normal range of motion.     Cervical back: Normal range of motion and neck supple.  Skin:    General: Skin is warm and dry.  Neurological:     General: No focal deficit present.     Mental Status: He is alert and oriented to person, place, and time. Mental status is at baseline.  Psychiatric:        Mood and Affect: Mood normal.        Behavior: Behavior normal.        Thought Content: Thought content normal.      UC Treatments / Results  Labs (all labs ordered are listed, but only abnormal results are displayed) Labs Reviewed - No data to display  EKG   Radiology No results found.  Procedures Procedures (including critical care time)  Medications Ordered in UC Medications - No data to display  Initial Impression / Assessment and Plan / UC Course  I have reviewed the triage vital signs and the nursing notes.  Pertinent labs & imaging results that were available during my care of the patient were reviewed by me and considered in my medical decision making (see chart for details).     MDM: 1.  Acute maxillary sinusitis, recurrence not specified-Rx'd Augmentin  875/125 mg tablet: Take 1 tablet twice daily x 7 days; 2.  Nasal congestion-Rx'd prednisone  20 mg tablet: Take 3 tablets p.o. daily x 5 days; 3.  Allergic rhinitis, unspecified seasonality, unspecified trigger-Rx'd Allegra  180 mg tablet: Take 1 tablet daily x 5 days. Advised patient take  medications as directed with food to completion.  Advised patient to take prednisone  and Allegra  with first dose of Augmentin  for the next 5 of 7 days.  Advised may use Allegra  as needed afterwards for concurrent postnasal drainage/drip/runny rose/sneezing.  Encouraged increase daily water intake to 64 ounces per day while taking these medications.  Advised if symptoms worsen and/or unresolved please follow-up with PCP or here for further evaluation. Final Clinical Impressions(s) / UC Diagnoses   Final diagnoses:  Acute maxillary sinusitis, recurrence not specified  Nasal congestion  Allergic rhinitis, unspecified seasonality, unspecified trigger     Discharge Instructions      Advised patient take medications as directed with food to completion.  Advised patient to take prednisone  and Allegra  with first dose of Augmentin  for the next 5 of 7 days.  Advised may use Allegra  as needed afterwards for concurrent postnasal drainage/drip/runny rose/sneezing.  Encouraged increase daily water intake to 64 ounces per day while taking these medications.  Advised if symptoms worsen and/or unresolved please follow-up with PCP or here for further evaluation.     ED Prescriptions     Medication Sig Dispense Auth. Provider   amoxicillin -clavulanate (AUGMENTIN ) 875-125 MG tablet Take 1 tablet by mouth every 12 (twelve) hours. 14 tablet Kiko Ripp, FNP   predniSONE  (DELTASONE ) 20 MG tablet Take 3 tabs PO daily x 5 days. 15 tablet Rogers Ditter, FNP   fexofenadine  (ALLEGRA  ALLERGY) 180 MG tablet Take 1 tablet (180 mg total) by mouth daily for 15 days. 15 tablet Andree Golphin, FNP      PDMP not reviewed this encounter.   Teddy Sharper, FNP 08/09/24 [phone]

## 2024-08-09 NOTE — Discharge Instructions (Addendum)
 Advised patient take medications as directed with food to completion.  Advised patient to take prednisone  and Allegra  with first dose of Augmentin  for the next 5 of 7 days.  Advised may use Allegra  as needed afterwards for concurrent postnasal drainage/drip/runny rose/sneezing.  Encouraged increase daily water intake to 64 ounces per day while taking these medications.  Advised if symptoms worsen and/or unresolved please follow-up with PCP or here for further evaluation.

## 2024-08-11 ENCOUNTER — Other Ambulatory Visit (HOSPITAL_BASED_OUTPATIENT_CLINIC_OR_DEPARTMENT_OTHER): Payer: Self-pay

## 2024-08-11 MED ORDER — HYDROXYZINE HCL 50 MG PO TABS
50.0000 mg | ORAL_TABLET | Freq: Three times a day (TID) | ORAL | 3 refills | Status: AC | PRN
  Filled 2024-08-11: qty 90, 30d supply, fill #0

## 2024-08-11 MED ORDER — AMPHETAMINE-DEXTROAMPHET ER 30 MG PO CP24
30.0000 mg | ORAL_CAPSULE | Freq: Every morning | ORAL | 0 refills | Status: DC
  Filled 2024-08-11: qty 30, 30d supply, fill #0

## 2024-08-11 MED ORDER — AMPHETAMINE-DEXTROAMPHETAMINE 30 MG PO TABS
30.0000 mg | ORAL_TABLET | Freq: Every day | ORAL | 0 refills | Status: DC
  Filled 2024-08-11: qty 30, 30d supply, fill #0

## 2024-09-18 ENCOUNTER — Other Ambulatory Visit (HOSPITAL_BASED_OUTPATIENT_CLINIC_OR_DEPARTMENT_OTHER): Payer: Self-pay

## 2024-09-18 ENCOUNTER — Other Ambulatory Visit: Payer: Self-pay

## 2024-09-18 MED ORDER — ADDERALL XR 30 MG PO CP24
30.0000 mg | ORAL_CAPSULE | Freq: Every morning | ORAL | 0 refills | Status: DC
  Filled 2024-09-18: qty 30, 30d supply, fill #0

## 2024-09-18 MED ORDER — HYDROXYZINE HCL 50 MG PO TABS
50.0000 mg | ORAL_TABLET | Freq: Three times a day (TID) | ORAL | 3 refills | Status: AC | PRN
  Filled 2024-09-18: qty 90, 30d supply, fill #0

## 2024-09-18 MED ORDER — AMPHETAMINE-DEXTROAMPHETAMINE 30 MG PO TABS
30.0000 mg | ORAL_TABLET | Freq: Every day | ORAL | 0 refills | Status: AC
  Filled 2024-09-18: qty 30, 30d supply, fill #0

## 2024-09-19 ENCOUNTER — Other Ambulatory Visit (HOSPITAL_BASED_OUTPATIENT_CLINIC_OR_DEPARTMENT_OTHER): Payer: Self-pay

## 2024-09-19 MED ORDER — AMPHETAMINE-DEXTROAMPHET ER 30 MG PO CP24
30.0000 mg | ORAL_CAPSULE | Freq: Every morning | ORAL | 0 refills | Status: DC
  Filled 2024-09-19: qty 30, 30d supply, fill #0

## 2024-09-19 MED ORDER — AMPHETAMINE-DEXTROAMPHETAMINE 30 MG PO TABS
30.0000 mg | ORAL_TABLET | Freq: Every day | ORAL | 0 refills | Status: AC
  Filled 2024-09-19: qty 30, 30d supply, fill #0

## 2024-10-17 ENCOUNTER — Other Ambulatory Visit: Payer: Self-pay

## 2024-10-17 ENCOUNTER — Other Ambulatory Visit (HOSPITAL_BASED_OUTPATIENT_CLINIC_OR_DEPARTMENT_OTHER): Payer: Self-pay

## 2024-10-17 MED ORDER — AMPHETAMINE-DEXTROAMPHET ER 30 MG PO CP24
30.0000 mg | ORAL_CAPSULE | Freq: Every morning | ORAL | 0 refills | Status: DC
  Filled 2024-10-17: qty 30, 30d supply, fill #0

## 2024-10-17 MED ORDER — AMPHETAMINE-DEXTROAMPHETAMINE 30 MG PO TABS
30.0000 mg | ORAL_TABLET | Freq: Every day | ORAL | 0 refills | Status: AC
  Filled 2024-10-17: qty 30, 30d supply, fill #0

## 2024-10-17 MED ORDER — HYDROXYZINE HCL 50 MG PO TABS
50.0000 mg | ORAL_TABLET | Freq: Three times a day (TID) | ORAL | 3 refills | Status: AC | PRN
  Filled 2024-10-17: qty 90, 30d supply, fill #0
  Filled 2024-11-17 – 2024-11-27 (×2): qty 90, 30d supply, fill #1

## 2024-10-20 ENCOUNTER — Encounter: Payer: Self-pay | Admitting: Radiology

## 2024-11-14 ENCOUNTER — Other Ambulatory Visit (HOSPITAL_BASED_OUTPATIENT_CLINIC_OR_DEPARTMENT_OTHER): Payer: Self-pay

## 2024-11-14 MED ORDER — AMPHETAMINE-DEXTROAMPHET ER 30 MG PO CP24
30.0000 mg | ORAL_CAPSULE | Freq: Every morning | ORAL | 0 refills | Status: DC
  Filled 2024-11-14 – 2024-11-17 (×2): qty 30, 30d supply, fill #0

## 2024-11-14 MED ORDER — AMPHETAMINE-DEXTROAMPHETAMINE 30 MG PO TABS
30.0000 mg | ORAL_TABLET | Freq: Every day | ORAL | 0 refills | Status: AC
  Filled 2024-11-14 – 2024-11-17 (×2): qty 30, 30d supply, fill #0

## 2024-11-17 ENCOUNTER — Other Ambulatory Visit (HOSPITAL_BASED_OUTPATIENT_CLINIC_OR_DEPARTMENT_OTHER): Payer: Self-pay

## 2024-11-27 ENCOUNTER — Other Ambulatory Visit (HOSPITAL_BASED_OUTPATIENT_CLINIC_OR_DEPARTMENT_OTHER): Payer: Self-pay

## 2024-12-16 ENCOUNTER — Other Ambulatory Visit (HOSPITAL_BASED_OUTPATIENT_CLINIC_OR_DEPARTMENT_OTHER): Payer: Self-pay

## 2024-12-16 MED ORDER — AMPHETAMINE-DEXTROAMPHETAMINE 30 MG PO TABS
30.0000 mg | ORAL_TABLET | Freq: Every day | ORAL | 0 refills | Status: AC
  Filled 2024-12-16: qty 30, 30d supply, fill #0

## 2024-12-16 MED ORDER — AMPHETAMINE-DEXTROAMPHET ER 30 MG PO CP24
30.0000 mg | ORAL_CAPSULE | Freq: Every morning | ORAL | 0 refills | Status: DC
  Filled 2024-12-16: qty 30, 30d supply, fill #0

## 2025-01-02 ENCOUNTER — Other Ambulatory Visit (HOSPITAL_BASED_OUTPATIENT_CLINIC_OR_DEPARTMENT_OTHER): Payer: Self-pay

## 2025-01-02 MED ORDER — HYDROXYZINE HCL 50 MG PO TABS
50.0000 mg | ORAL_TABLET | Freq: Three times a day (TID) | ORAL | 3 refills | Status: AC | PRN
  Filled 2025-01-02 – 2025-01-16 (×2): qty 90, 30d supply, fill #0

## 2025-01-14 ENCOUNTER — Other Ambulatory Visit (HOSPITAL_BASED_OUTPATIENT_CLINIC_OR_DEPARTMENT_OTHER): Payer: Self-pay

## 2025-01-16 ENCOUNTER — Other Ambulatory Visit (HOSPITAL_BASED_OUTPATIENT_CLINIC_OR_DEPARTMENT_OTHER): Payer: Self-pay

## 2025-01-16 MED ORDER — AMPHETAMINE-DEXTROAMPHET ER 30 MG PO CP24
30.0000 mg | ORAL_CAPSULE | Freq: Every morning | ORAL | 0 refills | Status: AC
  Filled 2025-01-16: qty 30, 30d supply, fill #0

## 2025-01-16 MED ORDER — AMPHETAMINE-DEXTROAMPHETAMINE 30 MG PO TABS
30.0000 mg | ORAL_TABLET | Freq: Every day | ORAL | 0 refills | Status: AC
  Filled 2025-01-16: qty 30, 30d supply, fill #0
